# Patient Record
Sex: Female | Born: 1950 | Race: White | Hispanic: No | Marital: Single | Smoking: Current every day smoker
Health system: Southern US, Community
[De-identification: ages and names within clinical notes are randomized; demographics above are authoritative.]

## PROBLEM LIST (undated history)

## (undated) DIAGNOSIS — I639 Cerebral infarction, unspecified: Secondary | ICD-10-CM

## (undated) DIAGNOSIS — D126 Benign neoplasm of colon, unspecified: Secondary | ICD-10-CM

## (undated) DIAGNOSIS — F329 Major depressive disorder, single episode, unspecified: Secondary | ICD-10-CM

## (undated) DIAGNOSIS — F419 Anxiety disorder, unspecified: Secondary | ICD-10-CM

## (undated) DIAGNOSIS — Z5189 Encounter for other specified aftercare: Secondary | ICD-10-CM

## (undated) DIAGNOSIS — M1990 Unspecified osteoarthritis, unspecified site: Secondary | ICD-10-CM

## (undated) DIAGNOSIS — K5900 Constipation, unspecified: Secondary | ICD-10-CM

## (undated) DIAGNOSIS — J449 Chronic obstructive pulmonary disease, unspecified: Secondary | ICD-10-CM

## (undated) DIAGNOSIS — H269 Unspecified cataract: Secondary | ICD-10-CM

## (undated) DIAGNOSIS — F32A Depression, unspecified: Secondary | ICD-10-CM

## (undated) DIAGNOSIS — E039 Hypothyroidism, unspecified: Secondary | ICD-10-CM

## (undated) HISTORY — DX: Unspecified osteoarthritis, unspecified site: M19.90

## (undated) HISTORY — DX: Cerebral infarction, unspecified: I63.9

## (undated) HISTORY — DX: Chronic obstructive pulmonary disease, unspecified: J44.9

## (undated) HISTORY — DX: Constipation, unspecified: K59.00

## (undated) HISTORY — DX: Unspecified cataract: H26.9

## (undated) HISTORY — DX: Major depressive disorder, single episode, unspecified: F32.9

## (undated) HISTORY — PX: POLYPECTOMY: SHX149

## (undated) HISTORY — PX: TONSILLECTOMY: SUR1361

## (undated) HISTORY — DX: Depression, unspecified: F32.A

## (undated) HISTORY — PX: COLONOSCOPY: SHX174

## (undated) HISTORY — DX: Anxiety disorder, unspecified: F41.9

## (undated) HISTORY — DX: Benign neoplasm of colon, unspecified: D12.6

## (undated) HISTORY — DX: Encounter for other specified aftercare: Z51.89

---
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
## 1999-08-28 ENCOUNTER — Other Ambulatory Visit: Admission: RE | Admit: 1999-08-28 | Discharge: 1999-08-28 | Payer: Self-pay | Admitting: Obstetrics and Gynecology

## 1999-09-10 ENCOUNTER — Ambulatory Visit (HOSPITAL_COMMUNITY): Admission: RE | Admit: 1999-09-10 | Discharge: 1999-09-10 | Payer: Self-pay | Admitting: Obstetrics and Gynecology

## 1999-09-10 ENCOUNTER — Encounter: Payer: Self-pay | Admitting: Obstetrics and Gynecology

## 1999-09-10 ENCOUNTER — Encounter (INDEPENDENT_AMBULATORY_CARE_PROVIDER_SITE_OTHER): Payer: Self-pay | Admitting: Specialist

## 2000-08-30 ENCOUNTER — Other Ambulatory Visit: Admission: RE | Admit: 2000-08-30 | Discharge: 2000-08-30 | Payer: Self-pay | Admitting: Obstetrics and Gynecology

## 2000-11-03 ENCOUNTER — Encounter: Admission: RE | Admit: 2000-11-03 | Discharge: 2000-11-03 | Payer: Self-pay | Admitting: Obstetrics and Gynecology

## 2000-11-03 ENCOUNTER — Encounter: Payer: Self-pay | Admitting: Obstetrics and Gynecology

## 2001-03-25 ENCOUNTER — Encounter: Payer: Self-pay | Admitting: Family Medicine

## 2001-03-25 ENCOUNTER — Encounter: Admission: RE | Admit: 2001-03-25 | Discharge: 2001-03-25 | Payer: Self-pay | Admitting: Family Medicine

## 2002-09-01 ENCOUNTER — Emergency Department (HOSPITAL_COMMUNITY): Admission: EM | Admit: 2002-09-01 | Discharge: 2002-09-01 | Payer: Self-pay | Admitting: Emergency Medicine

## 2002-09-04 ENCOUNTER — Emergency Department (HOSPITAL_COMMUNITY): Admission: EM | Admit: 2002-09-04 | Discharge: 2002-09-04 | Payer: Self-pay | Admitting: Emergency Medicine

## 2002-09-08 ENCOUNTER — Emergency Department (HOSPITAL_COMMUNITY): Admission: EM | Admit: 2002-09-08 | Discharge: 2002-09-08 | Payer: Self-pay | Admitting: Emergency Medicine

## 2002-09-15 ENCOUNTER — Emergency Department (HOSPITAL_COMMUNITY): Admission: EM | Admit: 2002-09-15 | Discharge: 2002-09-15 | Payer: Self-pay | Admitting: Emergency Medicine

## 2002-09-29 ENCOUNTER — Emergency Department (HOSPITAL_COMMUNITY): Admission: EM | Admit: 2002-09-29 | Discharge: 2002-09-29 | Payer: Self-pay | Admitting: Emergency Medicine

## 2005-01-12 DIAGNOSIS — D126 Benign neoplasm of colon, unspecified: Secondary | ICD-10-CM

## 2005-01-12 HISTORY — DX: Benign neoplasm of colon, unspecified: D12.6

## 2005-02-17 ENCOUNTER — Ambulatory Visit: Payer: Self-pay | Admitting: Gastroenterology

## 2005-03-06 ENCOUNTER — Ambulatory Visit: Payer: Self-pay | Admitting: Gastroenterology

## 2005-03-06 ENCOUNTER — Encounter (INDEPENDENT_AMBULATORY_CARE_PROVIDER_SITE_OTHER): Payer: Self-pay | Admitting: *Deleted

## 2007-01-13 DIAGNOSIS — Z5189 Encounter for other specified aftercare: Secondary | ICD-10-CM

## 2007-01-13 HISTORY — PX: SPINAL FUSION: SHX223

## 2007-01-13 HISTORY — DX: Encounter for other specified aftercare: Z51.89

## 2007-07-08 ENCOUNTER — Encounter: Admission: RE | Admit: 2007-07-08 | Discharge: 2007-07-08 | Payer: Self-pay | Admitting: Family Medicine

## 2008-05-17 ENCOUNTER — Ambulatory Visit: Payer: Self-pay | Admitting: Critical Care Medicine

## 2008-05-17 ENCOUNTER — Inpatient Hospital Stay (HOSPITAL_COMMUNITY): Admission: RE | Admit: 2008-05-17 | Discharge: 2008-05-29 | Payer: Self-pay | Admitting: Orthopedic Surgery

## 2008-05-21 ENCOUNTER — Encounter (INDEPENDENT_AMBULATORY_CARE_PROVIDER_SITE_OTHER): Payer: Self-pay | Admitting: Cardiology

## 2008-05-23 ENCOUNTER — Ambulatory Visit: Payer: Self-pay | Admitting: Vascular Surgery

## 2008-05-23 ENCOUNTER — Encounter (INDEPENDENT_AMBULATORY_CARE_PROVIDER_SITE_OTHER): Payer: Self-pay | Admitting: Orthopedic Surgery

## 2008-06-05 DIAGNOSIS — F4310 Post-traumatic stress disorder, unspecified: Secondary | ICD-10-CM | POA: Insufficient documentation

## 2008-06-05 DIAGNOSIS — J4489 Other specified chronic obstructive pulmonary disease: Secondary | ICD-10-CM | POA: Insufficient documentation

## 2008-06-05 DIAGNOSIS — Z780 Asymptomatic menopausal state: Secondary | ICD-10-CM | POA: Insufficient documentation

## 2008-06-05 DIAGNOSIS — F3289 Other specified depressive episodes: Secondary | ICD-10-CM | POA: Insufficient documentation

## 2008-06-05 DIAGNOSIS — F411 Generalized anxiety disorder: Secondary | ICD-10-CM | POA: Insufficient documentation

## 2008-06-05 DIAGNOSIS — J45909 Unspecified asthma, uncomplicated: Secondary | ICD-10-CM | POA: Insufficient documentation

## 2008-06-05 DIAGNOSIS — J449 Chronic obstructive pulmonary disease, unspecified: Secondary | ICD-10-CM

## 2008-06-05 DIAGNOSIS — F329 Major depressive disorder, single episode, unspecified: Secondary | ICD-10-CM

## 2008-06-05 DIAGNOSIS — M1990 Unspecified osteoarthritis, unspecified site: Secondary | ICD-10-CM | POA: Insufficient documentation

## 2009-11-04 ENCOUNTER — Emergency Department (HOSPITAL_COMMUNITY)
Admission: EM | Admit: 2009-11-04 | Discharge: 2009-11-04 | Payer: Self-pay | Source: Home / Self Care | Admitting: Emergency Medicine

## 2010-02-02 ENCOUNTER — Encounter: Payer: Self-pay | Admitting: Family Medicine

## 2010-02-06 ENCOUNTER — Encounter: Payer: Self-pay | Admitting: Gastroenterology

## 2010-02-13 NOTE — Letter (Signed)
Summary: Colonoscopy Letter  [facility]  [address] [place], [place] [address]   Phone: [phone]  Fax: [phone]      February 06, 2010 MRN: [id number]   Judith Hill [address] [place], [place]  [address]   Dear Ms. Ferch,   According to your medical record, it is time for you to schedule a Colonoscopy. The American Cancer Society recommends this procedure as a method to detect early colon cancer. Patients with a family history of colon cancer, or a personal history of colon polyps or inflammatory bowel disease are at increased risk.  This letter has been generated based on the recommendations made at the time of your procedure. If you feel that in your particular situation this may no longer apply, please contact our office.  Please call our office at [phone] to schedule this appointment or to update your records at your earliest convenience.  Thank you for cooperating with [place] to provide you with the very best care possible.   Sincerely,  Judie Petit T. Russella Dar, M.D.  [facility] [phone]

## 2010-04-22 LAB — GLUCOSE, CAPILLARY
Glucose-Capillary: 126 mg/dL — ABNORMAL HIGH (ref 70–99)
Glucose-Capillary: 160 mg/dL — ABNORMAL HIGH (ref 70–99)
Glucose-Capillary: 84 mg/dL (ref 70–99)
Glucose-Capillary: 101 mg/dL — ABNORMAL HIGH (ref 70–99)
Glucose-Capillary: 102 mg/dL — ABNORMAL HIGH (ref 70–99)
Glucose-Capillary: 104 mg/dL — ABNORMAL HIGH (ref 70–99)
Glucose-Capillary: 114 mg/dL — ABNORMAL HIGH (ref 70–99)
Glucose-Capillary: 121 mg/dL — ABNORMAL HIGH (ref 70–99)
Glucose-Capillary: 121 mg/dL — ABNORMAL HIGH (ref 70–99)
Glucose-Capillary: 129 mg/dL — ABNORMAL HIGH (ref 70–99)
Glucose-Capillary: 132 mg/dL — ABNORMAL HIGH (ref 70–99)
Glucose-Capillary: 134 mg/dL — ABNORMAL HIGH (ref 70–99)
Glucose-Capillary: 135 mg/dL — ABNORMAL HIGH (ref 70–99)
Glucose-Capillary: 135 mg/dL — ABNORMAL HIGH (ref 70–99)
Glucose-Capillary: 141 mg/dL — ABNORMAL HIGH (ref 70–99)
Glucose-Capillary: 148 mg/dL — ABNORMAL HIGH (ref 70–99)
Glucose-Capillary: 148 mg/dL — ABNORMAL HIGH (ref 70–99)
Glucose-Capillary: 149 mg/dL — ABNORMAL HIGH (ref 70–99)
Glucose-Capillary: 153 mg/dL — ABNORMAL HIGH (ref 70–99)
Glucose-Capillary: 157 mg/dL — ABNORMAL HIGH (ref 70–99)
Glucose-Capillary: 84 mg/dL (ref 70–99)

## 2010-04-22 LAB — URINE CULTURE

## 2010-04-22 LAB — BASIC METABOLIC PANEL
BUN: 22 mg/dL (ref 6–23)
BUN: 23 mg/dL (ref 6–23)
BUN: 12 mg/dL (ref 6–23)
CO2: 30 mEq/L (ref 19–32)
CO2: 23 mEq/L (ref 19–32)
CO2: 27 mEq/L (ref 19–32)
CO2: 28 mEq/L (ref 19–32)
CO2: 30 mEq/L (ref 19–32)
Calcium: 8.2 mg/dL — ABNORMAL LOW (ref 8.4–10.5)
Calcium: 8.7 mg/dL (ref 8.4–10.5)
Calcium: 7.5 mg/dL — ABNORMAL LOW (ref 8.4–10.5)
Calcium: 8.5 mg/dL (ref 8.4–10.5)
Calcium: 8.6 mg/dL (ref 8.4–10.5)
Calcium: 8.8 mg/dL (ref 8.4–10.5)
Chloride: 102 mEq/L (ref 96–112)
Chloride: 103 mEq/L (ref 96–112)
Chloride: 105 mEq/L (ref 96–112)
Chloride: 98 mEq/L (ref 96–112)
Creatinine, Ser: 0.93 mg/dL (ref 0.4–1.2)
Creatinine, Ser: 1.03 mg/dL (ref 0.4–1.2)
Creatinine, Ser: 0.7 mg/dL (ref 0.4–1.2)
Creatinine, Ser: 0.74 mg/dL (ref 0.4–1.2)
Creatinine, Ser: 0.83 mg/dL (ref 0.4–1.2)
Creatinine, Ser: 1.02 mg/dL (ref 0.4–1.2)
GFR calc Af Amer: >60 mL/min (ref >60)
GFR calc Af Amer: >60 mL/min (ref >60)
GFR calc Af Amer: >60 mL/min (ref >60)
GFR calc Af Amer: >60 mL/min (ref >60)
GFR calc Af Amer: >60 mL/min (ref >60)
GFR calc Af Amer: >60 mL/min (ref >60)
GFR calc non Af Amer: >60 mL/min (ref >60)
GFR calc non Af Amer: >60 mL/min (ref >60)
Glucose, Bld: 71 mg/dL (ref 70–99)
Glucose, Bld: 86 mg/dL (ref 70–99)
Glucose, Bld: 86 mg/dL (ref 70–99)
Glucose, Bld: 147 mg/dL — ABNORMAL HIGH (ref 70–99)
Glucose, Bld: 226 mg/dL — ABNORMAL HIGH (ref 70–99)
Potassium: 5 mEq/L (ref 3.5–5.1)
Potassium: 3.7 mEq/L (ref 3.5–5.1)
Potassium: 4.1 mEq/L (ref 3.5–5.1)
Sodium: 139 mEq/L (ref 135–145)
Sodium: 136 mEq/L (ref 135–145)

## 2010-04-22 LAB — CBC
HCT: 32.1 % — ABNORMAL LOW (ref 36.0–46.0)
HCT: 29.3 % — ABNORMAL LOW (ref 36.0–46.0)
Hemoglobin: 10.9 g/dL — ABNORMAL LOW (ref 12.0–15.0)
Hemoglobin: 11.8 g/dL — ABNORMAL LOW (ref 12.0–15.0)
MCHC: 33.8 g/dL (ref 30.0–36.0)
MCHC: 34 g/dL (ref 30.0–36.0)
MCHC: 34.1 g/dL (ref 30.0–36.0)
MCHC: 34.1 g/dL (ref 30.0–36.0)
MCHC: 34.4 g/dL (ref 30.0–36.0)
MCHC: 34.4 g/dL (ref 30.0–36.0)
MCV: 93.4 fL (ref 78.0–100.0)
MCV: 92.9 fL (ref 78.0–100.0)
MCV: 93.3 fL (ref 78.0–100.0)
Platelets: 232 10*3/uL (ref 150–400)
Platelets: 247 10*3/uL (ref 150–400)
Platelets: 127 10*3/uL — ABNORMAL LOW (ref 150–400)
Platelets: 134 10*3/uL — ABNORMAL LOW (ref 150–400)
RBC: 3.14 MIL/uL — ABNORMAL LOW (ref 3.87–5.11)
RBC: 3.14 MIL/uL — ABNORMAL LOW (ref 3.87–5.11)
RBC: 3.38 MIL/uL — ABNORMAL LOW (ref 3.87–5.11)
RBC: 3.51 MIL/uL — ABNORMAL LOW (ref 3.87–5.11)
RBC: 3.69 MIL/uL — ABNORMAL LOW (ref 3.87–5.11)
RDW: 14.4 % (ref 11.5–15.5)
RDW: 14.8 % (ref 11.5–15.5)
RDW: 14.8 % (ref 11.5–15.5)
RDW: 14.7 % (ref 11.5–15.5)
RDW: 14.9 % (ref 11.5–15.5)
WBC: 17.3 10*3/uL — ABNORMAL HIGH (ref 4.0–10.5)
WBC: 20.3 10*3/uL — ABNORMAL HIGH (ref 4.0–10.5)
WBC: 21.7 10*3/uL — ABNORMAL HIGH (ref 4.0–10.5)
WBC: 22 10*3/uL — ABNORMAL HIGH (ref 4.0–10.5)

## 2010-04-22 LAB — URINALYSIS, ROUTINE W REFLEX MICROSCOPIC
Bilirubin Urine: NEGATIVE
Glucose, UA: NEGATIVE mg/dL
Ketones, ur: NEGATIVE mg/dL
pH: 6 (ref 5.0–8.0)

## 2010-04-22 LAB — BLOOD GAS, ARTERIAL
Acid-Base Excess: 1.9 mmol/L (ref 0.0–2.0)
Acid-Base Excess: 2.4 mmol/L — ABNORMAL HIGH (ref 0.0–2.0)
Bicarbonate: 25.5 mEq/L — ABNORMAL HIGH (ref 20.0–24.0)
Bicarbonate: 25.9 mEq/L — ABNORMAL HIGH (ref 20.0–24.0)
Drawn by: 296031
FIO2: 40 %
Inspiratory PAP: 12
O2 Saturation: 92.5 %
pCO2 arterial: 36.1 mmHg (ref 35.0–45.0)
pCO2 arterial: 36.4 mmHg (ref 35.0–45.0)
pO2, Arterial: 58.8 mmHg — ABNORMAL LOW (ref 80.0–100.0)
pO2, Arterial: 86.8 mmHg (ref 80.0–100.0)

## 2010-04-22 LAB — MAGNESIUM
Magnesium: 2.5 mg/dL (ref 1.5–2.5)
Magnesium: 2.6 mg/dL — ABNORMAL HIGH (ref 1.5–2.5)

## 2010-04-22 LAB — CULTURE, BLOOD (ROUTINE X 2): Culture: NO GROWTH

## 2010-04-22 LAB — CK TOTAL AND CKMB (NOT AT ARMC)
CK, MB: 1.7 ng/mL (ref 0.3–4.0)
CK, MB: 2.6 ng/mL (ref 0.3–4.0)
Relative Index: 0.2 (ref 0.0–2.5)
Relative Index: 0.3 (ref 0.0–2.5)
Total CK: 1021 U/L — ABNORMAL HIGH (ref 7–177)
Total CK: 898 U/L — ABNORMAL HIGH (ref 7–177)

## 2010-04-22 LAB — CROSSMATCH

## 2010-04-22 LAB — POCT I-STAT 3, ART BLOOD GAS (G3+)
Acid-Base Excess: 2 mmol/L (ref 0.0–2.0)
Bicarbonate: 25.4 mEq/L — ABNORMAL HIGH (ref 20.0–24.0)
Bicarbonate: 28.6 mEq/L — ABNORMAL HIGH (ref 20.0–24.0)
O2 Saturation: 93 %
Patient temperature: 98.2
Patient temperature: 98.5
TCO2: 26 mmol/L (ref 0–100)
TCO2: 30 mmol/L (ref 0–100)
pCO2 arterial: 42.2 mmHg (ref 35.0–45.0)
pH, Arterial: 7.439 — ABNORMAL HIGH (ref 7.350–7.400)

## 2010-04-22 LAB — BRAIN NATRIURETIC PEPTIDE
Pro B Natriuretic peptide (BNP): 58 pg/mL (ref 0.0–100.0)
Pro B Natriuretic peptide (BNP): 429 pg/mL — ABNORMAL HIGH (ref 0.0–100.0)
Pro B Natriuretic peptide (BNP): 501 pg/mL — ABNORMAL HIGH (ref 0.0–100.0)

## 2010-04-22 LAB — DIFFERENTIAL
Eosinophils Absolute: 0 10*3/uL (ref 0.0–0.7)
Lymphocytes Relative: 3 % — ABNORMAL LOW (ref 12–46)
Lymphs Abs: 0.6 10*3/uL — ABNORMAL LOW (ref 0.7–4.0)
Monocytes Relative: 3 % (ref 3–12)
Neutrophils Relative %: 94 % — ABNORMAL HIGH (ref 43–77)

## 2010-04-22 LAB — HEMOGLOBIN AND HEMATOCRIT, BLOOD
HCT: 24 % — ABNORMAL LOW (ref 36.0–46.0)
Hemoglobin: 8.2 g/dL — ABNORMAL LOW (ref 12.0–15.0)

## 2010-04-22 LAB — TROPONIN I
Troponin I: 0.03 ng/mL (ref 0.00–0.06)
Troponin I: 0.04 ng/mL (ref 0.00–0.06)

## 2010-04-22 LAB — URINE MICROSCOPIC-ADD ON

## 2010-04-22 LAB — PHOSPHORUS
Phosphorus: 2.8 mg/dL (ref 2.3–4.6)
Phosphorus: 3.9 mg/dL (ref 2.3–4.6)
Phosphorus: 4.7 mg/dL — ABNORMAL HIGH (ref 2.3–4.6)

## 2010-04-22 LAB — SEDIMENTATION RATE: Sed Rate: 39 mm/hr — ABNORMAL HIGH (ref 0–22)

## 2010-04-22 LAB — POCT I-STAT 4, (NA,K, GLUC, HGB,HCT)
HCT: 30 % — ABNORMAL LOW (ref 36.0–46.0)
Hemoglobin: 10.2 g/dL — ABNORMAL LOW (ref 12.0–15.0)
Potassium: 5.1 mEq/L (ref 3.5–5.1)
Sodium: 143 mEq/L (ref 135–145)

## 2010-04-23 LAB — CBC
Hemoglobin: 13.9 g/dL (ref 12.0–15.0)
MCHC: 34.7 g/dL (ref 30.0–36.0)
MCV: 93.1 fL (ref 78.0–100.0)
RBC: 4.31 MIL/uL (ref 3.87–5.11)
WBC: 10.5 10*3/uL (ref 4.0–10.5)

## 2010-04-23 LAB — ABO/RH: ABO/RH(D): O POS

## 2010-05-27 NOTE — Op Note (Signed)
NAMECEARA, WRIGHTSON              ACCOUNT NO.:  [id number]   MEDICAL RECORD NO.:  [id number]          PATIENT TYPE:  INP   LOCATION:  2105                         FACILITY:  [facility]   PHYSICIAN:  Alvy Beal, MD    DATE OF BIRTH:  January 08, 1951   DATE OF PROCEDURE:  05/25/2008  DATE OF DISCHARGE:                               OPERATIVE REPORT   PREOPERATIVE DIAGNOSIS:  Wound dehiscence from previous posterior  transforaminal lumbar interbody fusion.   HISTORY:  This is a very pleasant 60 year old woman, a week ago the  patient underwent a 2-level TLIF at 4-5 and 5-1.  The patient's  postoperative course was complicated by respiratory failure.  She was  subsequently placed on a significant quantity of IV steroids for  pulmonary support.  Over the course of the last 4-5 days, the wound  began to dehisce.  There was also significant serous drainage, but no  erythema.   Because of the risk of wound infection and the fact that it was not  healing appropriately, the decision was made when she was cleared from  the critical care team standpoint to take her to the operating room for  I and D and wound closure.  This was discussed with the patient and  consent was obtained.   OPERATIVE NOTE:  The patient was brought to the operating room and  placed supine on the operating table.  After successful induction of  general anesthesia and endotracheal intubation, she was turned prone  onto the Wilson frame.  All bony prominences were well padded.  The back  was prepped and draped in a standard fashion with Betadine and soap.  The Monocryl stitch was noted to be broken.  This was easily removed and  the deep superficial 2-0 Vicryl sutures were also removed.  I then  removed the deep 1 Vicryl sutures to expose down to the hardware.  There  was no purulent material.  There was no significant bleeding.  I  irrigated copiously with normal saline.  I then reapproximated the  fascia with interrupted  #1 Vicryl suture.  I did place a deep drain  prior to doing that.  I then used a pulsatile lavage to clean the  superficial tissue.  I then closed in a layered fashion with 0 Vicryl  sutures to remove the dead space and then a 2-0 Vicryl suture.  On the  skin, I used a vertical running mattress with a 2-0 Prolene.   The wound itself was noted to be clean.  There was no foul odor or  evidence of infection.  A dry dressing was applied.  She was extubated  and transferred to PACU without incident.  At the end of case, all  needle and sponge counts were correct.      Alvy Beal, MD  Electronically Signed     DDB/MEDQ  D:  05/25/2008  T:  05/25/2008  Job:  [id number] [id number]

## 2010-05-27 NOTE — Consult Note (Signed)
NAME:  Judith Hill, Judith Hill              ACCOUNT NO.:  [id number]   MEDICAL RECORD NO.:  [id number]          PATIENT TYPE:  INP   LOCATION:  5003                         FACILITY:  [facility]   PHYSICIAN:  Darryl D. Prime, MD    DATE OF BIRTH:  08-16-1950   DATE OF CONSULTATION:  DATE OF DISCHARGE:                                 CONSULTATION   REASON FOR CONSULTATION:  A shortness of breath, hypoxia and  hypotension.   HISTORY OF PRESENT ILLNESS:  Judith Hill is a 60 year old female.  She  has a history of anxiety disorder, not otherwise specified, history of  post-traumatic stress disorder, history of depression, history of COPD,  history of asthma who has a family history of degenerative joint disease  with radiculopathy to the left leg and had elective surgery on May 17, 2008 for this.  She had degenerative lumbar disk disease L4-L5 and L5-  S1.  She had a transforaminal lumbar interbody fusion L4-S5, S5-S1 and  did well postoperatively.  She did, however, have a hemoglobin drop.  Her hemoglobin on April 29 was 13.9.  On May 6 at 1538, it was 9.6.  On  the morning of May 8, it was 8.2.  She had 2 units of packed red blood  cells.  The patient apparently over the last few hours (she could not  give a history because she is so short of breath) had progressive  shortness of breath and wheezing.  The patient had a chest x-ray ordered  around 2:00 on May 20, 2008, and it showed interval development of  significant bilateral air space process likely fulminant pulmonary  edema, ARD versus diffuse pneumonia also possible.  The patient is  having a cough and is profoundly short of breath.  Sats were 80% on room  air, placed on a face mask, 40 of IV Lasix was ordered, and we were  consulted.   PAST MEDICAL HISTORY AND PAST SURGICAL HISTORY:  As above.  1. Menopause.  2. Right arm fracture.  3. History of D&C in the past.   SOCIAL HISTORY:  She has a history of tobacco abuse, half pack per day  for the last 20 years.   FAMILY HISTORY:  The patient does not have a history of premature  coronary artery disease.   MEDICATIONS:  Unsure of the doses, but she is on Wellbutrin, estrogen,  progesterone, Xanax and possibly Cymbalta.   ALLERGIES:  She is allergic to penicillin and shellfish.   REVIEW OF SYSTEMS:  Could not be fully obtained due to significant  shortness of breath, but it is positive for arthritis and constipation.  She has been walking the halls since the surgery.   PHYSICAL EXAMINATION:  VITAL SIGNS:  Pulse is 108 with respiratory rate  in the range of about 32.  Sats as above.  The patient's temperature is  99, blood pressure initially was 89/40, recheck blood pressure was  120/50.  GENERAL:  The patient, in general, was an obese female in significant  respiratory distress on face mask.  HEENT:  Normocephalic, atraumatic.  Pupils equal, round  and reactive to  light.  Extraocular movements being intact.  Oropharynx is dry.  NECK:  Supple, no lymphadenopathy or thyromegaly.  No carotid bruits.  Jugular venous distention is very difficult to assess due to body  habitus.  LUNGS:  Shows bilateral inspiratory rales diffusely.  CARDIOVASCULAR:  Exam is regular rhythm with a fast rate and distant.  ABDOMEN:  Soft, nontender, nondistended with no splenomegaly.  There is  no tenderness.  EXTREMITIES:  No clubbing, cyanosis.  She has 2-3+ bilateral lower  extremity edema.  The wound on the back is clean, dry and intact.  The  patient is moving all extremities well.   LABORATORY DATA:  Recent laboratory data is pending.  Most recent labs  show a hemoglobin as above.  I-stat on May 17, 2008, showed a sodium of  143, potassium of 5, glucose of 77.  She did have on May 7 basic  metabolic panel showing sodium 119, potassium 4.5, chloride 107, CO2 23,  glucose 226, BUN of 12, creatinine 1.02.  The patient's EKG showed a  normal sinus mechanism, but it is tachycardic at 114  beats per minute.  Normal axis.  She did have R greater than S in V1 suggesting RV strain  type in the right ventricular hypertrophy with premature atrial  complexes.  The patient's chest x-ray is as above.   ASSESSMENT/PLAN:  This is a patient with a history of COPD who is  significantly short of breath after two units of packed red blood cells  and dropping her hemoglobin.  She is now wheezing and has rales.  She is  relatively hypotensive and profoundly hypoxic with increased work of  breathing.  At this time, we will transfer her to the ICU.  Will place  on moxifloxacin initially and consider vancomycin if she does not turn  around in the next few hours.  Sputum cultures and blood cultures will  be ordered.  Will also get a basic metabolic panel with CBC.  We will  place her on Solu-Medrol, nebs and get an arterial blood gas.  Solu-  Medrol will be times one dose for now.  As we feel she may have a  pneumonia here or at least a COPD exacerbation.  The patient's pulmonary  delay is suggested of congestive heart failure, and this will be most  likely the primary diagnosis.  At this time, we will get cardiac markers  x3 and follow her urine output with Lasix.  She may need additional  dose.  Ins' and out's will be strict with 2 grams sodium diet, weight  daily, 1500 mL fluid restriction.  Foley catheter was placed.  Will get  an echocardiogram.  We will follow her closely with you.      Darryl D. Prime, MD  Electronically Signed     DDP/MEDQ  D:  05/20/2008  T:  05/20/2008  Job:  [id number]

## 2010-05-27 NOTE — Op Note (Signed)
NAME:  Judith Hill, Judith Hill              ACCOUNT NO.:  [id number]   MEDICAL RECORD NO.:  [id number]          PATIENT TYPE:  INP   LOCATION:  2899                         FACILITY:  [facility]   PHYSICIAN:  Alvy Beal, MD    DATE OF BIRTH:  11/12/1950   DATE OF PROCEDURE:  DATE OF DISCHARGE:                               OPERATIVE REPORT   PREOPERATIVE DIAGNOSIS:  Degenerative lumbar disk disease with radicular  left leg pain, L4-5 and L5-S1.   POSTOPERATIVE DIAGNOSIS:  Degenerative lumbar disk disease with  radicular left leg pain, L4-5 and L5-S1.   OPERATIVE PROCEDURE:  Transforaminal lumbar interbody fusion L4-5, L5-  S1.   COMPLICATIONS:  None.   CONDITION:  Stable.   Instrumentations used was the Titan interbody cage, the long size 10 at  L4-5 and the long size 9 at L5-S1, packed with regional autograft bone,  mixed with Actifuse.  The pedicle screw system was the Stryker radius  pedicle screw system with pedicle screws placed on the right side at L4,  L5, and S1 and on the left at just L4 and S1.   FIRST ASSISTANT:  [name], PA.   HISTORY:  This is a very pleasant 60 year old woman who presented to my  office complaining of severe radicular left leg pain and ongoing  significant back pain.  Clinical and radiographic analysis confirmed the  diagnosis of two-level degenerative disk disease with diskogenic back  pain and radicular leg pain.  Attempts at conservative management have  failed to alleviate her symptoms and so she elected to proceed with  surgery.  All appropriate risks, benefits, and alternatives were  discussed with the patient and consent was obtained.   OPERATIVE NOTE:  The patient was brought to the operating room, placed  supine on the operating table.  After successful induction of general  anesthesia, endotracheal intubation, TEDs and SCD, and Foley were  applied and the patient was positioned supine on the [place] table.  All  bony prominences  were well-padded.  She was then turned prone onto the  spinal frame.  Again, the arms were placed overhead, and  I made sure  all bony prominences are well-padded.  The back was prepped and draped  in a standard fashion.  X-ray was then used to determine appropriate  incision site placement.  Once I had the incision sites, I then made a  midline incision, starting at the superior aspect of L4 and proceeding  down to the inferior aspect of S1.  Sharp dissection was carried out  through the adipose tissue down to the deep fascia.  Deep fascia was  sharply incised and then using a Cobb elevator, I sharply dissected the  paraspinal muscles to expose the entire L4, L5, and S1 spinous process,  the 3-4, 4-5, 5-1 facet complexes as well as the L4-L5 transverse  process and the sacral ala.  I then obtained hemostasis using bipolar  electrocautery.  Once I had the right side exposed, I had confirmed  again with lateral x-ray that I was at the appropriate level.  Once I  confirmed the L4  pedicle position, I then proceeded to the contralateral  side.  Using a Cobb elevator stripping technique, I approached the spine  in the same way I did on the right side to expose the entire left side.   At this point with the posterior spine properly dissected and  visualized, I then proceeded with my instrumentation.   I placed a pedicle awl at the junction of the transverse process and  lateral facet complex and the mid region of the transverse.  I then  under x-ray, confirmed satisfactory position, broached the cortex and  then used a pedicle finder to advance through the pedicle, and into the  intervertebral body.  At this point, this was done on the right-hand  side.  I then removed the pedicle probe palpated the hole with a ball-  tip feeler to ensure I had a solid bony canal and there was no pedicle  breach.  Once I confirmed this, I then tapped, rechecked with a ball-tip  feeler and then placed a 45-mm,  6.75 diameter pedicle screw at the L4  level.  I repeated this entire procedure at L5 on the right side and  placed same size screw.  At S1, I also repeated this procedure, this  same technique, but this time used a 6.25 diameter tap and then placed a  7.5 diameter 40-mm screw.  At this point, I rechecked the trajectory and  positioning of the pedicle screws in AP and lateral planes and again  they were noted to be satisfactory.   At this point with the right side instrumentation complete, I went to  the contralateral side.  Since the left side was the symptomatic side, I  elected not to put an L5 pedicle screw as I would be working above and  below this in order to do the TLIF and biomechanically it was not  necessary.   As such, using the same technique I used on the right-hand side, I  placed pedicle screws at L4 and at S1.  This was a 40-mm screw at L4,  and 35-mm screw at S1.   At this point in time with the hardware position, I then proceeded with  the decompression.   Using a double XL rongeur, I removed the entire S1 and L5 spinous  process and the majority of the L4.  I then developed a plane underneath  the S1 lamina and performed a complete laminectomy of S1.  This was  accomplished using double-action Leksell rongeur along with 2 and 3-mm  Kerrison rongeurs.  I resected the ligamentum flavum, exposed the  underlying thecal sac and removed the entire spinous process and lamina.  Of course, I left the majority of the right side lamina intact to apply  my bone graft to.  However, I did remove enough that I could have an  adequate central decompression.  I then proceeded at L5 and did a  complete laminectomy at L5.  I then did a partial laminotomy of L4.  At  this point in time, I had excellent visualization centrally.  I then  removed the entire L5 superior and inferior facet complex using an  osteotome.  I then resected the entire L4-L5 pars so that I could unroof  the L4  and L5 neural foramen.  At this point, I had clear visualization  of her exiting L4 nerve root, the L4 neural foramen, and the L5 nerve  root, and the L5 neural foramen.  I also was able to  visualize the  medial border of the S1 pedicle and the S1 nerve root.  I was able to  palpate down into the S1 foramen.  At this point, I then swept the  thecal sac medially and evaluated the L5-S1 disk space.  I placed a  neural patty to protect the exposed L5 nerve root and the S1 nerve root,  as it was traversing in the lateral recess.  I then retracted the thecal  sac and this gave me excellent visualization of the disk space.  I  confirmed that this was the L5-S1 disk space again with x-ray and then  incised the disk.  I then went up with consensual reamers, raspers, to  prep the endplates from size 7 up to size 9.  I noted the 9 rasper was  an excellent fit.  At this point, I then began completing my diskectomy  using a combination of angled rasps, curettes, and pituitary rongeurs.  I was able to remove all of the disk material, and I was able to  directly rasp the endplates of L5 and S1.  This was confirmed both with  x-ray and with auditory and tactile feedback.  Once I had prepped the  disk space at L5-S1 appropriately, I then mixed my bone graft that I had  harvested from the local decompression and mixed it with Actifuse.  I  then packed some of this in the anterior aspect of the disk space and  then obtained a size 9 Titan hydroxyapatite-coated cage, packet it with  bone graft and malleted it to the appropriate resting position.  Once I  had this graft properly seated, I irrigated the wound copiously with  normal saline and checked to ensure that there was no undue trauma to  the traversing S1 or exiting L5 nerve root.  Once I confirmed this, I  then moved to the L4 space.  Using the same technique that I used at L5  and S1, I performed a similar diskectomy.  At this point, I thought the  11  spacer would fit and I tried, however, it was too big.  I then  downsized it to a size 10.  The size 10 did go into the space and had  excellent interference fit.  At this point, once the TLIF cages were  positioned, I then contoured 2 rods, secured it to the pedicle screw  construct and compressed the construct.  I final locked all of the rods  and then placed a single cross-link for added torsional rigidity.  I  copiously irrigated the wound with normal saline and then using bipolar  electrocautery as well as FloSeal to obtain and maintain hemostasis.  A  deep drain was placed.  At this point, final x-rays were taken to  satisfactory position of the hardware in both the AP and lateral planes.   I then closed the deep fascia over the drain with interrupted #1 Vicryl  sutures, then a running 0 Vicryl stitch for the deep adipose tissue and  then and 2-0 on the superficial adipose tissue.  I then used a 3-0  Monocryl to close the skin edges.  Steri-Strips and dry dressing were  applied.  The patient was ultimately extubated and transferred to the  PACU without incident.  At the end of the case, all  needle and sponge  counts were correct.   ESTIMATED BLOOD LOSS:  1200 mL.      Alvy Beal, MD  Electronically Signed  DDB/MEDQ  D:  05/17/2008  T:  05/18/2008  Job:  [id number]

## 2010-05-30 NOTE — Discharge Summary (Signed)
NAME:  Judith Hill, Judith Hill              ACCOUNT NO.:  [id number]   MEDICAL RECORD NO.:  [id number]          PATIENT TYPE:  INP   LOCATION:  5030                         FACILITY:  [facility]   PHYSICIAN:  Alvy Beal, MD    DATE OF BIRTH:  08-07-1950   DATE OF ADMISSION:  05/17/2008  DATE OF DISCHARGE:  05/29/2008                               DISCHARGE SUMMARY   ADMISSION DIAGNOSIS:  Degenerative lumbar disk disease with radicular  left leg pain, L4-5 and L5-S1.   DISCHARGE DIAGNOSES:  Degenerative lumbar disk disease with radicular  left leg pain, L4-5 and L5-S1 plus posttraumatic pulmonary  insufficiency, obstructive chronic bronchitis with acute exacerbation.   PROCEDURES:  1. On May 17, 2008, transforaminal lumbar interbody fusion, L4-5 and L5-      S1.  2. On May 25, 2008, washout of the wound.   One consultation was obtained on May 20, 2008, which was Internal  Medicine consult.   BRIEF HISTORY:  [name] is a very pleasant 60 year old female, who  presented to Dr. Shon Baton' office complaining of severe radicular left leg  pain and ongoing significant back pain.  Clinical and radiographic  analysis confirmed the diagnosis of a 2-level degenerative disk disease  with diskogenic back pain and radicular leg pain.  All attempts at  conservative management have been tried and failed consisting of  physical therapy, injection therapy, nonnarcotic and narcotic  medications, so she elected to proceed with a surgery.  All appropriate  risks, benefits, and alternatives were discussed with the patient, and a  consent was obtained.   HOSPITAL COURSE:  The patient tolerated the initial procedure very well  and was transferred from the OR to the PACU without incident and  subsequently from the PACU to the orthopedic floor without incident.  On  postoperative day #1, the patient began to complain of some increasing  anxiety and poor pain control and some increasing anxiety attacks.  Postoperative day #2, the patient continued to complain of significant  pain and weakness.  Her hematocrit at the time was 27, therefore, Dr.  Shon Baton transfused her 2 units for a postop anemia.  Unfortunately,  during her transfusion, she developed increasing wheezing with shortness  of breath.  Chest x-ray done that night demonstrated significant  bilateral airspace most likely consistent with pulmonary edema, ARDS,  and diffuse pneumonia were also possible.  Therefore, an Internal  Medicine consultation was obtained.  Based on her O2 saturation and  ABGs, which demonstrated a blood gas of 7.4, pO2 of 86.8, pCO2 of 36.1,  bicarb of 25.9, and acid-base excess of 2.4, she was transferred to the  ICU.  The patient remained on the ICU for approximately 6 more days  until she was deemed stable.  It was noted on hospital course day #13,  Dr. Shon Baton was still concerned about some concern about her wound  healing as it was draining serosanguineous fluid and it appeared to be  not healing as was he would hope.  Therefore, on May 14, he did take her  back to the operating room for a washout and  wound reclosure.  After  this procedure, the patient began to show significant improvement.  Her  O2 sats have began to improve, and the patient on the May 18 was  therefore deemed as stable to be discharged home as she was working well  with physical therapy.  She was tolerating a regular diet.  She is  having regular bowel and bladder motion.  Her wound appeared to be  clean, dry, and intact with no further evidence of a wound dehiscence.  Therefore, the patient was deemed stable to be discharged home with home  health.   LABORATORIES AT DISCHARGE:  Sodium of 139, potassium of 4.5, chloride of  102, bicarb of 30, glucose was 71, BUN was 19, and creatinine was 1.3.  Her hemoglobin was 10.9 and her hematocrit was 32.2.   The patient's last set of vitals included a temperature of 97.5, pulse  of 91, blood  pressure of 98/63, a respiratory rate of 18, and 94%  saturation on room air.  The patient is being discharged to home with  home health care.   DISCHARGE MEDICATIONS:  1. New medicine of Percocet 10/325 one tablet p.o. every 6 hours for      pain.  2. Robaxin 500 mg 1 tablet p.o. q.8 h. p.r.n. muscle spasms.  3. Aspirin 81 mg daily.  4. She is allowed to continue her estrogen 0.5 mg.  5. Progesterone 200 mg daily.  6. Xanax 1 mg at night.  7. Wellbutrin XL 300 mg daily.  8. Symbyax 6/50 at night.  9. She is to discontinue her Darvocet.   The patient was given a preprinted discharge instructions that included  that she is to walk as much as possible.  She is to avoid lifting  anything over 6 pounds.  She is allowed to shower postoperative day #5.  She is not to soak the wound.  She is to call our office at [phone] to  schedule her followup appointment for a staple removal.  For  approximately 2 weeks after the date of her surgery, she is to call our  office again at [phone] to set up this appointment and she is to call  for any increasing pain, redness around the site, increased fevers  and/or chills.      [name], PA      Alvy Beal, MD  Electronically Signed    AC/MEDQ  D:  07/09/2008  T:  07/10/2008  Job:  [id number]

## 2010-05-30 NOTE — Op Note (Signed)
[facility]  Patient:    Judith Hill, Judith Hill                     MRN: [id number] Proc. Date: 09/10/99 Adm. Date:  [id number] Attending:  Rosalee Kaufman                           Operative Report  PREOPERATIVE DIAGNOSES:  Menometrorrhagia.  POSTOPERATIVE DIAGNOSES:  Menometrorrhagia.  OPERATION PERFORMED:  Dilation and curettage.  SURGEON:  Dr. Laureen Ochs.  ANESTHESIA:  MAC with local.  FINDINGS AND PROCEDURE:  The patient was prepped and draped in the usual fashion for a vaginal procedure. The patient examined and found to have a uterus top normal size. Adnexa clear. Following, a weighted speculum was placed in the posterior vagina and 1% xylocaine was infiltrated around the cervix. The cervix was grasped with a single tooth tenaculum and was sounded to 2 3/4 inches. The cervix was dilated and the cavity entered with a sharp curette. Only a small to moderate amount of tissue was obtained. No additional tissue was obtained with the round stone forceps and the serrated curette. The blood loss during the procedure was minimal. The patient tolerated the procedure well and was sent to the recovery room in good condition. DD:  09/10/99 TD:  09/11/99 Job: [id number]

## 2011-09-28 ENCOUNTER — Encounter: Payer: Self-pay | Admitting: Gastroenterology

## 2011-10-05 ENCOUNTER — Encounter: Payer: Self-pay | Admitting: Gastroenterology

## 2011-11-13 ENCOUNTER — Ambulatory Visit (AMBULATORY_SURGERY_CENTER): Payer: Medicare Other | Admitting: *Deleted

## 2011-11-13 VITALS — Ht 67.0 in | Wt 207.5 lb

## 2011-11-13 DIAGNOSIS — Z1211 Encounter for screening for malignant neoplasm of colon: Secondary | ICD-10-CM

## 2011-11-13 MED ORDER — MOVIPREP 100 G PO SOLR: ORAL | Status: DC

## 2011-11-27 ENCOUNTER — Ambulatory Visit (AMBULATORY_SURGERY_CENTER): Payer: Medicare Other | Admitting: Gastroenterology

## 2011-11-27 ENCOUNTER — Encounter: Payer: Self-pay | Admitting: Gastroenterology

## 2011-11-27 VITALS — BP 108/67 | HR 76 | Temp 97.8°F | Resp 17 | Ht 67.0 in | Wt 207.0 lb

## 2011-11-27 DIAGNOSIS — D126 Benign neoplasm of colon, unspecified: Secondary | ICD-10-CM

## 2011-11-27 DIAGNOSIS — Z1211 Encounter for screening for malignant neoplasm of colon: Secondary | ICD-10-CM

## 2011-11-27 DIAGNOSIS — Z86010 Personal history of colonic polyps: Secondary | ICD-10-CM

## 2011-11-27 MED ORDER — SODIUM CHLORIDE 0.9 % IV SOLN: 500.0000 mL | INTRAVENOUS | Status: DC

## 2011-11-27 NOTE — Op Note (Signed)
[facility] [address] [place], [address]   COLONOSCOPY PROCEDURE REPORT  PATIENT: Judith, Hill  MR#: [id number] BIRTHDATE: 07-27-1950 , 61  yrs. old GENDER: Female ENDOSCOPIST: Meryl Dare, MD, [facility] PROCEDURE DATE:  11/27/2011 PROCEDURE:   Colonoscopy with snare polypectomy ASA CLASS:   Class II INDICATIONS: patient's personal history of adenomatous colon polyps, 2007. MEDICATIONS: MAC sedation, administered by CRNA and propofol (Diprivan) 300mg  IV DESCRIPTION OF PROCEDURE:   After the risks benefits and alternatives of the procedure were thoroughly explained, informed consent was obtained.  A digital rectal exam revealed no abnormalities of the rectum.   The LB CF-[id number]  endoscope was introduced through the anus and advanced to the cecum, which was identified by both the appendix and ileocecal valve. No adverse events experienced.   The quality of the prep was excellent, using MoviPrep  The instrument was then slowly withdrawn as the colon was fully examined.  COLON FINDINGS: A sessile polyp measuring 1.4 cm in size was found at the hepatic flexure.  A piecemeal polypectomy was performed using snare cautery.  The resection was complete and the polyp tissue was completely retrieved.   Mild diverticulosis was noted in the sigmoid colon.   The colon was otherwise normal.  There was no diverticulosis, inflammation, polyps or cancers unless previously stated.  Retroflexed views revealed no abnormalities. The time to cecum=2 minutes 07 seconds.  Withdrawal time=11 minutes 19 seconds. The scope was withdrawn and the procedure completed.  COMPLICATIONS: There were no complications.  ENDOSCOPIC IMPRESSION: 1.   Sessile polyp measuring 1.4 cm at the hepatic flexure; polypectomy performed using snare cautery 2.   Mild diverticulosis was noted in the sigmoid colon   RECOMMENDATIONS: 1.  Hold aspirin, aspirin products, and anti-inflammatory  medication for 2 weeks. 2.  Await pathology results 3.  Repeat colonoscopy in 1 year if polyp adenomatous; 3 years if hyperplastic  eSigned:  Meryl Dare, MD, [facility] 11/27/2011 10:17 AM   cc: Benedetto Goad, MD

## 2011-11-27 NOTE — Patient Instructions (Addendum)
Impressions/recommendations:  Polyp-handout given Diverticulosis-handout given High fiber diet-handout given  Hold aspirin, aspirin products, and antiinflammatory medications for 2 weeks. May restart 12/12/11.  Repeat colonoscopy is dependent upon pathology results. Letter will be mailed within 1-3 weeks.  YOU HAD AN ENDOSCOPIC PROCEDURE TODAY AT THE [facility]: Refer to the procedure report that was given to you for any specific questions about what was found during the examination.  If the procedure report does not answer your questions, please call your gastroenterologist to clarify.  If you requested that your care partner not be given the details of your procedure findings, then the procedure report has been included in a sealed envelope for you to review at your convenience later.  YOU SHOULD EXPECT: Some feelings of bloating in the abdomen. Passage of more gas than usual.  Walking can help get rid of the air that was put into your GI tract during the procedure and reduce the bloating. If you had a lower endoscopy (such as a colonoscopy or flexible sigmoidoscopy) you may notice spotting of blood in your stool or on the toilet paper. If you underwent a bowel prep for your procedure, then you may not have a normal bowel movement for a few days.  DIET: Your first meal following the procedure should be a light meal and then it is ok to progress to your normal diet.  A half-sandwich or bowl of soup is an example of a good first meal.  Heavy or fried foods are harder to digest and may make you feel nauseous or bloated.  Likewise meals heavy in dairy and vegetables can cause extra gas to form and this can also increase the bloating.  Drink plenty of fluids but you should avoid alcoholic beverages for 24 hours.  ACTIVITY: Your care partner should take you home directly after the procedure.  You should plan to take it easy, moving slowly for the rest of the day.  You can resume normal  activity the day after the procedure however you should NOT DRIVE or use heavy machinery for 24 hours (because of the sedation medicines used during the test).    SYMPTOMS TO REPORT IMMEDIATELY: A gastroenterologist can be reached at any hour.  During normal business hours, 8:30 AM to 5:00 PM Monday through Friday, call [phone].  After hours and on weekends, please call the GI answering service at [phone] who will take a message and have the physician on call contact you.   Following lower endoscopy (colonoscopy or flexible sigmoidoscopy):  Excessive amounts of blood in the stool  Significant tenderness or worsening of abdominal pains  Swelling of the abdomen that is new, acute  Fever of 100F or higher   FOLLOW UP: If any biopsies were taken you will be contacted by phone or by letter within the next 1-3 weeks.  Call your gastroenterologist if you have not heard about the biopsies in 3 weeks.  Our staff will call the home number listed on your records the next business day following your procedure to check on you and address any questions or concerns that you may have at that time regarding the information given to you following your procedure. This is a courtesy call and so if there is no answer at the home number and we have not heard from you through the emergency physician on call, we will assume that you have returned to your regular daily activities without incident.  SIGNATURES/CONFIDENTIALITY: You and/or your care partner have signed  paperwork which will be entered into your electronic medical record.  These signatures attest to the fact that that the information above on your After Visit Summary has been reviewed and is understood.  Full responsibility of the confidentiality of this discharge information lies with you and/or your care-partner.

## 2011-11-27 NOTE — Progress Notes (Signed)
Patient did not experience any of the following events: a burn prior to discharge; a fall within the facility; wrong site/side/patient/procedure/implant event; or a hospital transfer or hospital admission upon discharge from the facility. (G8907) Patient did not have preoperative order for IV antibiotic SSI prophylaxis. (G8918)  

## 2011-11-30 ENCOUNTER — Telehealth: Payer: Self-pay | Admitting: *Deleted

## 2011-11-30 NOTE — Telephone Encounter (Signed)
Left message

## 2011-12-01 ENCOUNTER — Encounter: Payer: Self-pay | Admitting: Gastroenterology

## 2012-09-19 ENCOUNTER — Encounter: Payer: Self-pay | Admitting: Gastroenterology

## 2012-10-10 ENCOUNTER — Encounter (HOSPITAL_COMMUNITY): Payer: Self-pay

## 2012-10-10 ENCOUNTER — Emergency Department (HOSPITAL_COMMUNITY): Payer: Medicare Other

## 2012-10-10 ENCOUNTER — Emergency Department (HOSPITAL_COMMUNITY)
Admission: EM | Admit: 2012-10-10 | Discharge: 2012-10-10 | Disposition: A | Discharge status: Home / Self Care | Payer: Medicare Other | Attending: Emergency Medicine | Admitting: Emergency Medicine

## 2012-10-10 DIAGNOSIS — F3289 Other specified depressive episodes: Secondary | ICD-10-CM | POA: Insufficient documentation

## 2012-10-10 DIAGNOSIS — F17200 Nicotine dependence, unspecified, uncomplicated: Secondary | ICD-10-CM | POA: Insufficient documentation

## 2012-10-10 DIAGNOSIS — F411 Generalized anxiety disorder: Secondary | ICD-10-CM | POA: Insufficient documentation

## 2012-10-10 DIAGNOSIS — Z79899 Other long term (current) drug therapy: Secondary | ICD-10-CM | POA: Insufficient documentation

## 2012-10-10 DIAGNOSIS — F329 Major depressive disorder, single episode, unspecified: Secondary | ICD-10-CM | POA: Insufficient documentation

## 2012-10-10 DIAGNOSIS — H538 Other visual disturbances: Secondary | ICD-10-CM | POA: Insufficient documentation

## 2012-10-10 DIAGNOSIS — R51 Headache: Secondary | ICD-10-CM | POA: Insufficient documentation

## 2012-10-10 DIAGNOSIS — R110 Nausea: Secondary | ICD-10-CM | POA: Insufficient documentation

## 2012-10-10 DIAGNOSIS — Z8739 Personal history of other diseases of the musculoskeletal system and connective tissue: Secondary | ICD-10-CM | POA: Insufficient documentation

## 2012-10-10 DIAGNOSIS — M542 Cervicalgia: Secondary | ICD-10-CM | POA: Insufficient documentation

## 2012-10-10 DIAGNOSIS — R42 Dizziness and giddiness: Secondary | ICD-10-CM | POA: Insufficient documentation

## 2012-10-10 DIAGNOSIS — Z880 Allergy status to penicillin: Secondary | ICD-10-CM | POA: Insufficient documentation

## 2012-10-10 LAB — CBC WITH DIFFERENTIAL/PLATELET
Basophils Absolute: 0.1 10*3/uL (ref 0.0–0.1)
Basophils Relative: 1 % (ref 0–1)
Eosinophils Absolute: 0.1 10*3/uL (ref 0.0–0.7)
Eosinophils Relative: 2 % (ref 0–5)
HCT: 42.5 % (ref 36.0–46.0)
Lymphocytes Relative: 25 % (ref 12–46)
Lymphs Abs: 1.9 10*3/uL (ref 0.7–4.0)
MCH: 31.5 pg (ref 26.0–34.0)
MCV: 95.7 fL (ref 78.0–100.0)
Monocytes Absolute: 0.4 10*3/uL (ref 0.1–1.0)
Monocytes Relative: 6 % (ref 3–12)
Neutro Abs: 5 10*3/uL (ref 1.7–7.7)
Platelets: 212 10*3/uL (ref 150–400)
RBC: 4.44 MIL/uL (ref 3.87–5.11)
RDW: 14 % (ref 11.5–15.5)
WBC: 7.5 10*3/uL (ref 4.0–10.5)

## 2012-10-10 LAB — BASIC METABOLIC PANEL
CO2: 27 mEq/L (ref 19–32)
Calcium: 9.7 mg/dL (ref 8.4–10.5)
Chloride: 104 mEq/L (ref 96–112)
Creatinine, Ser: 0.79 mg/dL (ref 0.50–1.10)
GFR calc Af Amer: >90 mL/min (ref >90)
Glucose, Bld: 115 mg/dL — ABNORMAL HIGH (ref 70–99)
Potassium: 4.1 mEq/L (ref 3.5–5.1)

## 2012-10-10 LAB — TROPONIN I: Troponin I: <0.3 ng/mL (ref <0.30)

## 2012-10-10 MED ORDER — ONDANSETRON HCL 4 MG/2ML IJ SOLN
4.0000 mg | Freq: Once | INTRAMUSCULAR | Status: AC
  Administered 2012-10-10: 4 mg via INTRAVENOUS
  Filled 2012-10-10: qty 2

## 2012-10-10 MED ORDER — ONDANSETRON 4 MG PO TBDP: 4.0000 mg | ORAL_TABLET | Freq: Three times a day (TID) | ORAL | Status: DC | PRN

## 2012-10-10 MED ORDER — MECLIZINE HCL 12.5 MG PO TABS
25.0000 mg | ORAL_TABLET | Freq: Once | ORAL | Status: AC
  Administered 2012-10-10: 25 mg via ORAL
  Filled 2012-10-10: qty 2

## 2012-10-10 MED ORDER — SODIUM CHLORIDE 0.9 % IV BOLUS (SEPSIS)
500.0000 mL | Freq: Once | INTRAVENOUS | Status: AC
  Administered 2012-10-10: 500 mL via INTRAVENOUS

## 2012-10-10 MED ORDER — SODIUM CHLORIDE 0.9 % IV SOLN: INTRAVENOUS | Status: DC

## 2012-10-10 MED ORDER — MECLIZINE HCL 25 MG PO TABS: 25.0000 mg | ORAL_TABLET | Freq: Four times a day (QID) | ORAL | Status: DC

## 2012-10-10 MED ORDER — HYDROMORPHONE HCL PF 1 MG/ML IJ SOLN
1.0000 mg | Freq: Once | INTRAMUSCULAR | Status: AC
  Administered 2012-10-10: 1 mg via INTRAVENOUS
  Filled 2012-10-10: qty 1

## 2012-10-10 NOTE — ED Notes (Signed)
C/o dizziness that started this am appox 8am per family. Pt states she woke up and " room was spinning" c/o left jaw pain and left side headache,

## 2012-10-10 NOTE — ED Provider Notes (Signed)
CSN: [id number]     Arrival date & time 10/10/12  4098 History  This chart was scribed for Shelda Jakes, MD by Bennett Scrape, ED Scribe. This patient was seen in room APA12/APA12 and the patient's care was started at 10:59 AM.   Chief Complaint  Patient presents with  . Dizziness    Patient is a 62 y.o. female presenting with general illness. The history is provided by the patient. No language interpreter was used.  Illness Location:  Dizziness Quality:  Room spinning Severity:  Severe Onset quality:  Sudden Duration:  3 hours Timing:  Constant Progression:  Unchanged Chronicity:  New Context:  Started upon waking Relieved by:  Laying down Worsened by:  Movement Associated symptoms: headaches (left) and nausea   Associated symptoms: no abdominal pain, no chest pain, no congestion, no cough, no diarrhea, no fever, no rash, no shortness of breath, no sore throat and no vomiting     HPI Comments: Judith Hill is a 62 y.o. female who presents to the Emergency Department complaining of dizziness that started at 8 AM today upon waking. Pt states that she sat up after laying on her left-side on her couch and had sudden onset dizziness described as room spinning. She states that the symptoms are worsened with movement, particularly head positioning, and improved with laying down. She states that she laid down on her right side to improve her symptoms but developed associated mild left jaw pain, mild left-sided HA and mild left hand paresthesias while laying down. The HA is located in the left forehead region and she rates her pain a 3 out of 10 currently. She states that is was a 5 with the original onset. She states that she has since developed nausea slowly in the ED as well. She denies having any symptoms prior to going to bed last night. Pt denies having prior episodes of similar symptoms. She denies emesis, fevers, CP and SOB as associated symptoms.  PCP is Dr. Benedetto Goad with  [facility] in [place]   Past Medical History  Diagnosis Date  . Anxiety   . Depression   . Arthritis   . Blood transfusion without reported diagnosis 2009    after spinal fusion   Past Surgical History  Procedure Laterality Date  . Spinal fusion  2009  . Fracture arm  1965    compund fracture right arm; 7 surgeries with bone graft and skin graft   Family History  Problem Relation Age of Onset  . Colon cancer Paternal Grandmother 51  . Stomach cancer Neg Hx   . Rectal cancer Neg Hx    History  Substance Use Topics  . Smoking status: Current Every Day Smoker -- 0.50 packs/day    Types: Cigarettes  . Smokeless tobacco: Never Used  . Alcohol Use: No   No OB history provided.  Review of Systems  Constitutional: Negative for fever and chills.  HENT: Positive for neck pain (left). Negative for congestion and sore throat.   Eyes: Positive for visual disturbance.  Respiratory: Negative for cough and shortness of breath.   Cardiovascular: Negative for chest pain and leg swelling.  Gastrointestinal: Positive for nausea. Negative for vomiting, abdominal pain and diarrhea.  Genitourinary: Negative for dysuria.  Musculoskeletal: Negative for back pain.  Skin: Negative for rash.  Neurological: Positive for dizziness and headaches (left).  Hematological: Does not bruise/bleed easily.  Psychiatric/Behavioral: Negative for confusion.    Allergies  Penicillins  Home Medications   Current Outpatient  Rx  Name  Route  Sig  Dispense  Refill  . albuterol (PROVENTIL HFA;VENTOLIN HFA) 108 (90 BASE) MCG/ACT inhaler   Inhalation   Inhale 2 puffs into the lungs every 6 (six) hours as needed for wheezing.         Marland Kitchen ALPRAZolam (XANAX) 1 MG tablet   Oral   Take 1 mg by mouth at bedtime as needed.          Marland Kitchen b complex vitamins tablet   Oral   Take 1 tablet by mouth daily.         Marland Kitchen buPROPion (WELLBUTRIN XL) 150 MG 24 hr tablet   Oral   Take 150 mg by mouth daily.          Marland Kitchen escitalopram (LEXAPRO) 20 MG tablet   Oral   Take 20 mg by mouth daily.         . Multiple Vitamins-Minerals (MULTIVITAMINS THER. W/MINERALS) TABS tablet   Oral   Take 1 tablet by mouth daily.         . Omega-3 Fatty Acids (FISH OIL PO)   Oral   Take 1 capsule by mouth daily.          Marland Kitchen OVER THE COUNTER MEDICATION   Oral   Take 1 tablet by mouth daily as needed (pain). OTC pain reliever.         . traMADol (ULTRAM) 50 MG tablet   Oral   Take 50 mg by mouth daily as needed for pain.         . meclizine (ANTIVERT) 25 MG tablet   Oral   Take 1 tablet (25 mg total) by mouth 4 (four) times daily.   28 tablet   0   . ondansetron (ZOFRAN ODT) 4 MG disintegrating tablet   Oral   Take 1 tablet (4 mg total) by mouth every 8 (eight) hours as needed.   10 tablet   0    Triage Vitals: BP 129/81  Pulse 76  Temp(Src) 98.5 F (36.9 C) (Oral)  Resp 17  SpO2 96%  Physical Exam  Nursing note and vitals reviewed. Constitutional: She is oriented to person, place, and time. She appears well-developed and well-nourished. No distress.  HENT:  Head: Normocephalic and atraumatic.  Mouth/Throat: Oropharynx is clear and moist.  Eyes: Conjunctivae and EOM are normal. Pupils are equal, round, and reactive to light.  Sclera are clear  Neck: Neck supple. No tracheal deviation present.  Cardiovascular: Normal rate and regular rhythm.   No murmur heard. Pulses:      Dorsalis pedis pulses are 2+ on the right side, and 2+ on the left side.  Pulmonary/Chest: Effort normal and breath sounds normal. No respiratory distress. She has no wheezes.  Abdominal: Soft. Bowel sounds are normal. She exhibits no distension. There is no tenderness.  Musculoskeletal: Normal range of motion. She exhibits no edema (no ankle swelling).  Lymphadenopathy:    She has no cervical adenopathy.  Neurological: She is alert and oriented to person, place, and time. No cranial nerve deficit.  Pt able to move  both sets of fingers and toes  Skin: Skin is warm and dry. No rash noted.  Psychiatric: She has a normal mood and affect. Her behavior is normal.    ED Course  Procedures (including critical care time)  DIAGNOSTIC STUDIES: Oxygen Saturation is 96% on room air, normal by my interpretation.    COORDINATION OF CARE: 11:07 AM-Informed pt that her CT scan was  normal. Discussed treatment plan which includes Antivert, MRI of the brain, CBC panel and BMP with pt at bedside and pt agreed to plan.   Labs Review Labs Reviewed  BASIC METABOLIC PANEL - Abnormal; Notable for the following:    Glucose, Bld 115 (*)    GFR calc non Af Amer 89 (*)    All other components within normal limits  CBC WITH DIFFERENTIAL  TROPONIN I   Imaging Review Dg Chest 1 View  10/10/2012   CLINICAL DATA:  Dizziness  EXAM: CHEST - 1 VIEW  COMPARISON:  Chest radiograph 11/04/2009  FINDINGS: Normal cardiac silhouette. There is mild bronchitic change centrally. There is pleural thickening at the lung apices unchanged from prior. No effusion, infiltrate, or pneumothorax.  IMPRESSION: No acute cardiopulmonary process.   Electronically Signed   By: Genevive Bi M.D.   On: 10/10/2012 13:42   Ct Head Wo Contrast  10/10/2012   CLINICAL DATA:  Dizziness with left-sided headache  EXAM: CT HEAD WITHOUT CONTRAST  TECHNIQUE: Contiguous axial images were obtained from the base of the skull through the vertex without intravenous contrast. Study was obtained within 24 hr of patient's arrival at the emergency department.  COMPARISON:  None.  FINDINGS: There is age related volume loss. There is no mass, hemorrhage, extra-axial fluid collection, or midline shift. There are no focal gray-white compartment lesions. No acute infarct present. Bony calvarium appears intact. The mastoid air cells are clear.  IMPRESSION: Age related volume loss. Study otherwise unremarkable.   Electronically Signed   By: Bretta Bang   On: 10/10/2012 10:01    Mr Brain Wo Contrast  10/10/2012   CLINICAL DATA:  The headache and weakness. Dizziness.  EXAM: MRI HEAD WITHOUT CONTRAST  TECHNIQUE: Multiplanar, multisequence MR imaging was performed. No intravenous contrast was administered.  COMPARISON:  CT head without contrast 10/10/2012.  FINDINGS: The diffusion-weighted images demonstrate no evidence for acute or subacute infarction. Minimal subcortical white matter disease is within normal limits for age. White matter changes are noted within the brainstem and. Ventricles are of normal size. No significant extra-axial fluid collection is present. A remote lacunar infarct of the right cerebellum demonstrates focal susceptibility suggesting a remote hemorrhage. No associated mass lesion is present.  Flow is present in the major intracranial arteries. The globes and orbits are intact the paranasal sinuses and mastoid air cells are clear.  IMPRESSION: And  1. Punctate hemorrhage within the and right cerebellum is compatible with a remote focal hemorrhagic lacunar infarct. 2. Bold no acute intracranial abnormality. 3. Mild white matter changes within the brainstem likely reflects the sequela of chronic microvascular ischemia.   Electronically Signed   By: Gennette Pac   On: 10/10/2012 13:35   Results for orders placed during the hospital encounter of 10/10/12  CBC WITH DIFFERENTIAL      Result Value Range   WBC 7.5  4.0 - 10.5 K/uL   RBC 4.44  3.87 - 5.11 MIL/uL   Hemoglobin 14.0  12.0 - 15.0 g/dL   HCT 16.1  09.6 - 04.5 %   MCV 95.7  78.0 - 100.0 fL   MCH 31.5  26.0 - 34.0 pg   MCHC 32.9  30.0 - 36.0 g/dL   RDW 40.9  81.1 - 91.4 %   Platelets 212  150 - 400 K/uL   Neutrophils Relative % 66  43 - 77 %   Neutro Abs 5.0  1.7 - 7.7 K/uL   Lymphocytes Relative 25  12 -  46 %   Lymphs Abs 1.9  0.7 - 4.0 K/uL   Monocytes Relative 6  3 - 12 %   Monocytes Absolute 0.4  0.1 - 1.0 K/uL   Eosinophils Relative 2  0 - 5 %   Eosinophils Absolute 0.1  0.0 - 0.7 K/uL    Basophils Relative 1  0 - 1 %   Basophils Absolute 0.1  0.0 - 0.1 K/uL  TROPONIN I      Result Value Range   Troponin I <0.30  <0.30 ng/mL  BASIC METABOLIC PANEL      Result Value Range   Sodium 139  135 - 145 mEq/L   Potassium 4.1  3.5 - 5.1 mEq/L   Chloride 104  96 - 112 mEq/L   CO2 27  19 - 32 mEq/L   Glucose, Bld 115 (*) 70 - 99 mg/dL   BUN 11  6 - 23 mg/dL   Creatinine, Ser 1.61  0.50 - 1.10 mg/dL   Calcium 9.7  8.4 - 09.6 mg/dL   GFR calc non Af Amer 89 (*) >90 mL/min   GFR calc Af Amer >90  >90 mL/min     Date: 10/10/2012  Rate: 86  Rhythm: normal sinus rhythm  QRS Axis: normal  Intervals: normal  ST/T Wave abnormalities: normal  Conduction Disutrbances:none  Narrative Interpretation:   Old EKG Reviewed: changes noted Resolution of sinus tachycardia and premature atrial complexes compared to EKG on 05/20/2008   MDM   1. Vertigo    Paced MRI of the brain is negative for any acute stroke. Or temporal tumor. Patient's symptoms consistent with positional vertigo. Improve somewhat with Antivert. Will continue with Antivert. Labs without any significant abnormalities. EKG without arrhythmias. Patient has primary care Dr. to followup with. MRI does show evidence of remote stroke that the patient was aware of.  I personally performed the services described in this documentation, which was scribed in my presence. The recorded information has been reviewed and is accurate.     Shelda Jakes, MD 10/10/12 [phone]

## 2013-04-28 ENCOUNTER — Ambulatory Visit (INDEPENDENT_AMBULATORY_CARE_PROVIDER_SITE_OTHER): Payer: Medicare Other | Admitting: Cardiology

## 2013-04-28 ENCOUNTER — Encounter: Payer: Self-pay | Admitting: Cardiology

## 2013-04-28 VITALS — BP 130/88 | HR 84 | Ht 67.0 in | Wt 229.0 lb

## 2013-04-28 DIAGNOSIS — R0600 Dyspnea, unspecified: Secondary | ICD-10-CM

## 2013-04-28 DIAGNOSIS — I6350 Cerebral infarction due to unspecified occlusion or stenosis of unspecified cerebral artery: Secondary | ICD-10-CM

## 2013-04-28 DIAGNOSIS — E669 Obesity, unspecified: Secondary | ICD-10-CM | POA: Insufficient documentation

## 2013-04-28 DIAGNOSIS — R0609 Other forms of dyspnea: Secondary | ICD-10-CM

## 2013-04-28 DIAGNOSIS — Z720 Tobacco use: Secondary | ICD-10-CM

## 2013-04-28 DIAGNOSIS — F4310 Post-traumatic stress disorder, unspecified: Secondary | ICD-10-CM

## 2013-04-28 DIAGNOSIS — R0989 Other specified symptoms and signs involving the circulatory and respiratory systems: Secondary | ICD-10-CM

## 2013-04-28 DIAGNOSIS — R42 Dizziness and giddiness: Secondary | ICD-10-CM

## 2013-04-28 DIAGNOSIS — I639 Cerebral infarction, unspecified: Secondary | ICD-10-CM

## 2013-04-28 DIAGNOSIS — F17200 Nicotine dependence, unspecified, uncomplicated: Secondary | ICD-10-CM

## 2013-04-28 DIAGNOSIS — R079 Chest pain, unspecified: Secondary | ICD-10-CM

## 2013-04-28 NOTE — Patient Instructions (Signed)
Your physician recommends that you continue on your current medications as directed. Please refer to the Current Medication list given to you today.  Your physician has requested that you have an echocardiogram. Echocardiography is a painless test that uses sound waves to create images of your heart. It provides your doctor with information about the size and shape of your heart and how well your heart's chambers and valves are working. This procedure takes approximately one hour. There are no restrictions for this procedure.  Your physician has requested that you have a lexiscan myoview. For further information please visit [email or web address]. Please follow instruction sheet, as given.  Will determine when to follow up after studies completed

## 2013-04-28 NOTE — Progress Notes (Signed)
Judith Hill Date of Birth:  [birth year] [address] [place], [place]  [address] [phone]         Fax   [phone]  History of Present Illness: This pleasant 63 year old woman is seen for the first time in my office.  She is seen at the request of Dr. Kathryne Eriksson.  The patient is concerned about vascular disease.  She has been having occasional chest discomfort often secondary to emotional stress.  She has a past history of post traumatic stress disorder.  She is on disability because of prior back surgery in 2010.  She states that during the hospitalization for her back surgery she developed pulmonary complications after receiving blood transfusion.  She has had problems with vertigo and was seen in the emergency room at [facility] on 10/10/12.  She underwent a MRI of the head which showed punctate hemorrhage within the right cerebellum compatible with a remote focal hemorrhagic lacunar infarct there was also white matter changes within the brain stem likely reflecting sequela of chronic microvascular ischemia.  The patient denies any history of high blood pressure.  She denies any history of hypercholesterolemia or diabetes.  He does smoke a pack of cigarettes a day.  She has experienced post traumatic stress disorder after seeing a person killed in front of her eyes.  She was also been under chronic stress regarding a 19 year old daughter who unfortunately is a drug addict.  The patient has not noted any exertional chest discomfort.  She does have exertional dyspnea.  She feels that she has problems with fluid retention at times.  Current Outpatient Prescriptions  Medication Sig Dispense Refill  . albuterol (PROVENTIL HFA;VENTOLIN HFA) 108 (90 BASE) MCG/ACT inhaler Inhale 2 puffs into the lungs every 6 (six) hours as needed for wheezing.      Marland Kitchen ALPRAZolam (XANAX) 1 MG tablet Take 1 mg by mouth at bedtime as needed.       Marland Kitchen b complex vitamins tablet Take 1 tablet by  mouth daily.      Marland Kitchen escitalopram (LEXAPRO) 20 MG tablet Take 20 mg by mouth daily.      . meclizine (ANTIVERT) 25 MG tablet Take 25 mg by mouth 2 (two) times daily as needed.      . Multiple Vitamins-Minerals (MULTIVITAMINS THER. W/MINERALS) TABS tablet Take 1 tablet by mouth daily.      . Omega-3 Fatty Acids (FISH OIL PO) Take 1 capsule by mouth daily.       . ondansetron (ZOFRAN ODT) 4 MG disintegrating tablet Take 1 tablet (4 mg total) by mouth every 8 (eight) hours as needed.  10 tablet  0  . OVER THE COUNTER MEDICATION Take 1 tablet by mouth daily as needed (pain). OTC pain reliever.      . traMADol (ULTRAM) 50 MG tablet Take 50 mg by mouth daily as needed for pain.       No current facility-administered medications for this visit.    Allergies  Allergen Reactions  . Penicillins Rash    Patient Active Problem List   Diagnosis Date Noted  . Dizziness 04/28/2013  . CVA (cerebral vascular accident) 04/28/2013  . Obesity (BMI 30-39.9) 04/28/2013  . ANXIETY DISORDER 06/05/2008  . PTSD 06/05/2008  . DEPRESSION 06/05/2008  . ASTHMA 06/05/2008  . COPD 06/05/2008  . DEGENERATIVE JOINT DISEASE 06/05/2008  . POSTMENOPAUSAL STATUS 06/05/2008    History  Smoking status  . Current Every Day Smoker -- 0.50 packs/day  .  Types: Cigarettes  Smokeless tobacco  . Never Used    History  Alcohol Use No    Family History  Problem Relation Age of Onset  . Colon cancer Paternal Grandmother 28  . Stomach cancer Neg Hx   . Rectal cancer Neg Hx     Review of Systems: Constitutional: no fever chills diaphoresis or fatigue or change in weight.  Head and neck: no hearing loss, no epistaxis, no photophobia or visual disturbance. Respiratory: No cough, positive for exertional dyspnea. Cardiovascular: Positive for occasional palpitations and for chest pain often secondary to emotional stress Gastrointestinal: No abdominal distention, no abdominal pain, no change in bowel habits hematochezia  or melena. Genitourinary: No dysuria, no frequency, no urgency, no nocturia. Musculoskeletal:No arthralgias, no back pain, no gait disturbance or myalgias. Neurological: No dizziness, no headaches, no numbness, no seizures, no syncope, no weakness, no tremors.  Positive for history of vertigo Hematologic: No lymphadenopathy, no easy bruising. Psychiatric: No confusion, no hallucinations, no sleep disturbance.  Positive for post traumatic stress disorder    Physical Exam: Filed Vitals:   04/28/13 1501  BP: 130/88  Pulse: 84   the general appearance reveals a large middle-aged woman in no acute distress.The head and neck exam reveals pupils equal and reactive.  Extraocular movements are full.  There is no scleral icterus.  The mouth and pharynx are normal.  The neck is supple.  The carotids reveal no bruits.  The jugular venous pressure is normal.  The  thyroid is not enlarged.  There is no lymphadenopathy.  The chest is clear to percussion and auscultation.  There are no rales or rhonchi.  Expansion of the chest is symmetrical.  The precordium is quiet.  The first heart sound is normal.  The second heart sound is physiologically split.  There is no murmur gallop rub or click.  There is no abnormal lift or heave.  The abdomen is soft and nontender.  The bowel sounds are normal.  The liver and spleen are not enlarged.  There are no abdominal masses.  There are no abdominal bruits.  Extremities reveal good pedal pulses.  There is no phlebitis or edema.  There is no cyanosis or clubbing.  Strength is normal and symmetrical in all extremities.  There is no lateralizing weakness.  There are no sensory deficits.  The skin is warm and dry.  There is no rash.  EKG shows normal sinus rhythm and no ischemic changes at rest.  A previous EKG from 2013 had shown anteroseptal T wave inversion in leads V1 and V2.   Assessment / Plan: 1.  dyspnea of uncertain etiology associated with atypical chest pain.  Risk  factors of tobacco abuse and exogenous obesity and sedentary lifestyle. 2.  history of remote stroke seen on MRI from September 2014 3. posttraumatic stress disorder 4. Obesity  Plan: The patient was counseled about the importance of stopping smoking.  She was also counseled about the importance of adhering to a heart healthy diet and losing weight. We will have her return for a two-dimensional echocardiogram and for a  Lexiscan Myoview stress test. Thank you for the opportunity to see this pleasant woman review and we will be in touch regarding the results of her studies

## 2013-05-29 ENCOUNTER — Telehealth: Payer: Self-pay | Admitting: Cardiology

## 2013-05-29 ENCOUNTER — Ambulatory Visit (HOSPITAL_COMMUNITY): Payer: Medicare Other | Attending: Cardiology | Admitting: Radiology

## 2013-05-29 ENCOUNTER — Ambulatory Visit (HOSPITAL_BASED_OUTPATIENT_CLINIC_OR_DEPARTMENT_OTHER): Payer: Medicare Other | Admitting: Radiology

## 2013-05-29 VITALS — BP 119/57 | HR 72 | Ht 67.0 in | Wt 232.0 lb

## 2013-05-29 DIAGNOSIS — R072 Precordial pain: Secondary | ICD-10-CM

## 2013-05-29 DIAGNOSIS — R0609 Other forms of dyspnea: Secondary | ICD-10-CM | POA: Insufficient documentation

## 2013-05-29 DIAGNOSIS — R51 Headache: Secondary | ICD-10-CM

## 2013-05-29 DIAGNOSIS — F17200 Nicotine dependence, unspecified, uncomplicated: Secondary | ICD-10-CM | POA: Insufficient documentation

## 2013-05-29 DIAGNOSIS — R079 Chest pain, unspecified: Secondary | ICD-10-CM

## 2013-05-29 DIAGNOSIS — R0600 Dyspnea, unspecified: Secondary | ICD-10-CM

## 2013-05-29 DIAGNOSIS — R0602 Shortness of breath: Secondary | ICD-10-CM

## 2013-05-29 DIAGNOSIS — Z8673 Personal history of transient ischemic attack (TIA), and cerebral infarction without residual deficits: Secondary | ICD-10-CM | POA: Insufficient documentation

## 2013-05-29 DIAGNOSIS — Z8249 Family history of ischemic heart disease and other diseases of the circulatory system: Secondary | ICD-10-CM | POA: Insufficient documentation

## 2013-05-29 DIAGNOSIS — I639 Cerebral infarction, unspecified: Secondary | ICD-10-CM

## 2013-05-29 DIAGNOSIS — R002 Palpitations: Secondary | ICD-10-CM | POA: Insufficient documentation

## 2013-05-29 DIAGNOSIS — R42 Dizziness and giddiness: Secondary | ICD-10-CM

## 2013-05-29 DIAGNOSIS — R0989 Other specified symptoms and signs involving the circulatory and respiratory systems: Secondary | ICD-10-CM | POA: Insufficient documentation

## 2013-05-29 MED ORDER — TECHNETIUM TC 99M SESTAMIBI GENERIC - CARDIOLITE
33.0000 | Freq: Once | INTRAVENOUS | Status: AC | PRN
  Administered 2013-05-29: 33 via INTRAVENOUS

## 2013-05-29 MED ORDER — REGADENOSON 0.4 MG/5ML IV SOLN
0.4000 mg | Freq: Once | INTRAVENOUS | Status: AC
  Administered 2013-05-29: 0.4 mg via INTRAVENOUS

## 2013-05-29 MED ORDER — AMINOPHYLLINE 25 MG/ML IV SOLN
75.0000 mg | Freq: Once | INTRAVENOUS | Status: AC
  Administered 2013-05-29: 75 mg via INTRAVENOUS

## 2013-05-29 NOTE — Telephone Encounter (Signed)
Advised patient of results.  

## 2013-05-29 NOTE — Progress Notes (Signed)
[facility] [address] [place],  [address] [phone]    Cardiology Nuclear Med Study  Judith Hill is a 63 y.o. female     MRN : [id number]     DOB: 1950/09/12  Procedure Date: 05/29/2013  Nuclear Med Background Indication for Stress Test:  Evaluation for Ischemia History: No prior known history of CAD, 2010 Echo: EF=55-60% Cardiac Risk Factors: CVA, Family History - CAD and Smoker  Symptoms: Chest Pain with/without exertion (last occurrence yesterday), Dizziness, DOE and Palpitations   Nuclear Pre-Procedure Caffeine/Decaff Intake:  None>12 hrs NPO After: 8:30pm   Lungs:  Minimal expiratory wheeze that cleared after cough. Albuterol Inhaler 2 sprays prior to [place]. O2 Sat: 98% on room air. IV 0.9% NS with Angio Cath:  22g  IV Site: L Hand x 1, tolerated well IV Started by:  Irven Baltimore, RN  Chest Size (in):  44 Cup Size: D  Height: 5\' 7"  (1.702 m)  Weight:  232 lb (105.235 kg)  BMI:  Body mass index is 36.33 kg/(m^2). Tech Comments:  N/A    Nuclear Med Study 1 or 2 day study: 2 day  Stress Test Type:  Lexiscan  Reading MD: N/A  Order Authorizing Provider:  Darlin Coco, MD  Resting Radionuclide: Technetium 33m Sestamibi  Resting Radionuclide Dose: 33.0 mCi  06/01/13  Stress Radionuclide:  Technetium 59m Sestamibi  Stress Radionuclide Dose: 33.0 mCi  06/29/13          Stress Protocol Rest HR: 72 Stress HR: 104  Rest BP: 119/57 Stress BP: 111/61  Exercise Time (min): n/a METS: n/a   Predicted Max HR: 160 bpm % Max HR: 65 bpm Rate Pressure Product: 11544   Dose of Adenosine (mg):  n/a Dose of Lexiscan: 0.4 mg  Dose of Atropine (mg): n/a Dose of Dobutamine: n/a mcg/kg/min (at max HR)  Stress Test Technologist: Irven Baltimore, RN  Nuclear Technologist:  Charlton Amor, CNMT     Rest Procedure:  Myocardial perfusion imaging was performed at rest 45 minutes following the intravenous administration of Technetium 25m  Sestamibi. Rest ECG: NSR - Normal EKG  Stress Procedure:  The patient received IV Lexiscan 0.4 mg over 15-seconds.  Technetium 68m Sestamibi injected at 30-seconds.  Quantitative spect images were obtained after a 45 minute delay. Aminophylline 75 mg IVP given 7 minutes post Lexiscan due to persistent chest pressure and headache with quick resolution of symptoms.  Stress ECG: No significant change from baseline ECG  QPS Raw Data Images:  Mild breast attenuation.  Normal left ventricular size. Stress Images:  Normal homogeneous uptake in all areas of the myocardium except for mild reduction in the anteroapical distrbution Rest Images:  Comparison with the stress images reveals no significant change. Subtraction (SDS):  There is a fixed anteriour defect that is most consistent with breast attenuation. Transient Ischemic Dilatation (Normal <1.22):  1.02 Lung/Heart Ratio (Normal <0.45):  0.71  Quantitative Gated Spect Images QGS EDV:  85 ml QGS ESV:  28 ml  Impression Exercise Capacity:  Lexiscan with no exercise. BP Response:  Normal blood pressure response. Clinical Symptoms:  No significant symptoms noted. ECG Impression:  No significant ECG changes with Lexiscan. Comparison with Prior Nuclear Study: No previous nuclear study performed  Overall Impression:  Low risk stress nuclear study mild breast attenuation artifact.  LV Ejection Fraction: 67%.  LV Wall Motion:  NL LV Function; NL Wall Motion   Sanda Klein, MD, [facility] [phone] office (  [phone] pager

## 2013-05-29 NOTE — Progress Notes (Signed)
Echocardiogram performed.  

## 2013-05-29 NOTE — Telephone Encounter (Signed)
New message ° ° ° ° ° ° ° ° ° °Pt returning nurses call °

## 2013-05-29 NOTE — Telephone Encounter (Signed)
Message copied by Earvin Hansen on Mon May 29, 2013  4:07 PM ------      Message from: Darlin Coco      Created: Mon May 29, 2013  1:50 PM       Please report.  The echo was normal. ------

## 2013-06-01 ENCOUNTER — Ambulatory Visit (HOSPITAL_COMMUNITY): Payer: Medicare Other | Attending: Cardiovascular Disease

## 2013-06-01 DIAGNOSIS — R0989 Other specified symptoms and signs involving the circulatory and respiratory systems: Secondary | ICD-10-CM

## 2013-06-01 MED ORDER — TECHNETIUM TC 99M SESTAMIBI GENERIC - CARDIOLITE
33.0000 | Freq: Once | INTRAVENOUS | Status: AC | PRN
  Administered 2013-06-01: 33 via INTRAVENOUS

## 2013-06-02 ENCOUNTER — Encounter: Payer: Self-pay | Admitting: *Deleted

## 2013-06-02 ENCOUNTER — Telehealth: Payer: Self-pay | Admitting: *Deleted

## 2013-06-02 NOTE — Telephone Encounter (Signed)
Advised patient

## 2013-06-02 NOTE — Telephone Encounter (Signed)
Message copied by Earvin Hansen on Fri Jun 02, 2013 11:20 AM ------      Message from: Darlin Coco      Created: Fri Jun 02, 2013 10:40 AM       Please report.  The stress test is normal.  No ischemia or scar. ------

## 2013-06-10 ENCOUNTER — Encounter: Payer: Self-pay | Admitting: Gastroenterology

## 2013-07-07 ENCOUNTER — Encounter: Payer: Self-pay | Admitting: Gastroenterology

## 2013-09-11 ENCOUNTER — Ambulatory Visit (AMBULATORY_SURGERY_CENTER): Payer: Self-pay

## 2013-09-11 VITALS — Ht 66.5 in | Wt 233.4 lb

## 2013-09-11 DIAGNOSIS — Z860100 Personal history of colon polyps, unspecified: Secondary | ICD-10-CM

## 2013-09-11 MED ORDER — MOVIPREP 100 G PO SOLR: 1.0000 | Freq: Once | ORAL | Status: DC

## 2013-09-11 NOTE — Progress Notes (Signed)
No allergies to eggs or soy No home oxygen but does use nebulizer if needed No diet/weight loss meds No past problems with anesthesia BUT  Had blood tx p spinal fusion surgery in 2009, reaction to tx caused lungs to fill with fluid in ICU 9days now has lung damage from that experience  Has email  Emmi instructions given for colonoscopy

## 2013-09-20 ENCOUNTER — Encounter: Payer: Self-pay | Admitting: Gastroenterology

## 2013-09-25 ENCOUNTER — Ambulatory Visit (AMBULATORY_SURGERY_CENTER): Payer: Medicare Other | Admitting: Gastroenterology

## 2013-09-25 ENCOUNTER — Encounter: Payer: Self-pay | Admitting: Gastroenterology

## 2013-09-25 VITALS — BP 110/68 | HR 66 | Temp 97.8°F | Resp 14 | Ht 66.5 in | Wt 233.0 lb

## 2013-09-25 DIAGNOSIS — Z860100 Personal history of colon polyps, unspecified: Secondary | ICD-10-CM

## 2013-09-25 DIAGNOSIS — D120 Benign neoplasm of cecum: Secondary | ICD-10-CM

## 2013-09-25 DIAGNOSIS — D126 Benign neoplasm of colon, unspecified: Secondary | ICD-10-CM

## 2013-09-25 MED ORDER — SODIUM CHLORIDE 0.9 % IV SOLN: 500.0000 mL | INTRAVENOUS | Status: DC

## 2013-09-25 NOTE — Patient Instructions (Signed)
Discharge instructions given with verbal understanding. Handout on polyps. Resume previous medications. YOU HAD AN ENDOSCOPIC PROCEDURE TODAY AT THE [facility]: Refer to the procedure report that was given to you for any specific questions about what was found during the examination.  If the procedure report does not answer your questions, please call your gastroenterologist to clarify.  If you requested that your care partner not be given the details of your procedure findings, then the procedure report has been included in a sealed envelope for you to review at your convenience later.  YOU SHOULD EXPECT: Some feelings of bloating in the abdomen. Passage of more gas than usual.  Walking can help get rid of the air that was put into your GI tract during the procedure and reduce the bloating. If you had a lower endoscopy (such as a colonoscopy or flexible sigmoidoscopy) you may notice spotting of blood in your stool or on the toilet paper. If you underwent a bowel prep for your procedure, then you may not have a normal bowel movement for a few days.  DIET: Your first meal following the procedure should be a light meal and then it is ok to progress to your normal diet.  A half-sandwich or bowl of soup is an example of a good first meal.  Heavy or fried foods are harder to digest and may make you feel nauseous or bloated.  Likewise meals heavy in dairy and vegetables can cause extra gas to form and this can also increase the bloating.  Drink plenty of fluids but you should avoid alcoholic beverages for 24 hours.  ACTIVITY: Your care partner should take you home directly after the procedure.  You should plan to take it easy, moving slowly for the rest of the day.  You can resume normal activity the day after the procedure however you should NOT DRIVE or use heavy machinery for 24 hours (because of the sedation medicines used during the test).    SYMPTOMS TO REPORT IMMEDIATELY: A  gastroenterologist can be reached at any hour.  During normal business hours, 8:30 AM to 5:00 PM Monday through Friday, call [phone].  After hours and on weekends, please call the GI answering service at [phone] who will take a message and have the physician on call contact you.   Following lower endoscopy (colonoscopy or flexible sigmoidoscopy):  Excessive amounts of blood in the stool  Significant tenderness or worsening of abdominal pains  Swelling of the abdomen that is new, acute  Fever of 100F or higher  FOLLOW UP: If any biopsies were taken you will be contacted by phone or by letter within the next 1-3 weeks.  Call your gastroenterologist if you have not heard about the biopsies in 3 weeks.  Our staff will call the home number listed on your records the next business day following your procedure to check on you and address any questions or concerns that you may have at that time regarding the information given to you following your procedure. This is a courtesy call and so if there is no answer at the home number and we have not heard from you through the emergency physician on call, we will assume that you have returned to your regular daily activities without incident.  SIGNATURES/CONFIDENTIALITY: You and/or your care partner have signed paperwork which will be entered into your electronic medical record.  These signatures attest to the fact that that the information above on your After Visit Summary has been   reviewed and is understood.  Full responsibility of the confidentiality of this discharge information lies with you and/or your care-partner. 

## 2013-09-25 NOTE — Progress Notes (Signed)
Called to room to assist during endoscopic procedure.  Patient ID and intended procedure confirmed with present staff. Received instructions for my participation in the procedure from the performing physician.  

## 2013-09-25 NOTE — Progress Notes (Signed)
Procedure ends, to recovery, report given and VSS. 

## 2013-09-25 NOTE — Op Note (Signed)
[place]  [address] [place], [address]   COLONOSCOPY PROCEDURE REPORT  PATIENT: Judith Hill, Judith Hill  MR#: [id number] BIRTHDATE: 04-Oct-1950 , 63  yrs. old GENDER: Female ENDOSCOPIST: Ladene Artist, MD, [facility] PROCEDURE DATE:  09/25/2013 PROCEDURE:   Colonoscopy with biopsy First Screening Colonoscopy - Avg.  risk and is 50 yrs.  old or older - No.  Prior Negative Screening - Now for repeat screening. N/A  History of Adenoma - Now for follow-up colonoscopy & has been > or = to 3 yrs.  Yes hx of adenoma.  Has been 3 or more years since last colonoscopy.  Polyps Removed Today? Yes. ASA CLASS:   Class II INDICATIONS:Patient's personal history of adenomatous colon polyps.  MEDICATIONS: MAC sedation, administered by CRNA and propofol (Diprivan) 240mg  IV DESCRIPTION OF PROCEDURE:   After the risks benefits and alternatives of the procedure were thoroughly explained, informed consent was obtained.  A digital rectal exam revealed no abnormalities of the rectum.   The LB [id number]  endoscope was introduced through the anus and advanced to the cecum, which was identified by both the appendix and ileocecal valve. No adverse events experienced.   The quality of the prep was excellent, using MoviPrep  The instrument was then slowly withdrawn as the colon was fully examined.  COLON FINDINGS: A sessile polyp measuring 5 mm in size was found at the cecum.  A polypectomy was performed with cold forceps.  The resection was complete and the polyp tissue was completely retrieved.   The colon was otherwise normal.  There was no diverticulosis, inflammation, polyps or cancers unless previously stated.  Retroflexed views revealed small internal hemorrhoids. The time to cecum=2 minutes 00 seconds.  Withdrawal time=9 minutes 14 seconds.  The scope was withdrawn and the procedure completed.  COMPLICATIONS: There were no complications.  ENDOSCOPIC IMPRESSION: 1.   Sessile  polyp measuring 5 mm at the cecum; polypectomy performed with cold forceps 2.   Small internal hemorrrhoids  RECOMMENDATIONS: 1.  Await pathology results 2.  High fiber diet with liberal fluid intake. 3.  Repeat Colonoscopy in 5 years.  eSigned:  Ladene Artist, MD, [facility] 09/25/2013 10:09 AM

## 2013-09-26 ENCOUNTER — Telehealth: Payer: Self-pay | Admitting: *Deleted

## 2013-09-26 NOTE — Telephone Encounter (Signed)
  Follow up Call-  Call back number 09/25/2013 11/27/2011  Post procedure Call Back phone  # [phone]  Permission to leave phone message Yes Yes     Patient questions:  Do you have a fever, pain , or abdominal swelling? No. Pain Score  0 *  Have you tolerated food without any problems? Yes.    Have you been able to return to your normal activities? Yes.    Do you have any questions about your discharge instructions: Diet   No. Medications  No. Follow up visit  No.  Do you have questions or concerns about your Care? No.  Actions: * If pain score is 4 or above: No action needed, pain <4.

## 2013-10-01 ENCOUNTER — Encounter: Payer: Self-pay | Admitting: Gastroenterology

## 2014-07-06 ENCOUNTER — Encounter: Payer: Self-pay | Admitting: Gastroenterology

## 2015-04-18 DIAGNOSIS — I10 Essential (primary) hypertension: Secondary | ICD-10-CM | POA: Insufficient documentation

## 2015-10-30 DIAGNOSIS — M545 Low back pain, unspecified: Secondary | ICD-10-CM | POA: Insufficient documentation

## 2015-10-30 DIAGNOSIS — G8929 Other chronic pain: Secondary | ICD-10-CM | POA: Insufficient documentation

## 2016-04-29 DIAGNOSIS — R7989 Other specified abnormal findings of blood chemistry: Secondary | ICD-10-CM | POA: Insufficient documentation

## 2016-10-28 DIAGNOSIS — R6882 Decreased libido: Secondary | ICD-10-CM | POA: Insufficient documentation

## 2016-10-28 DIAGNOSIS — L309 Dermatitis, unspecified: Secondary | ICD-10-CM | POA: Insufficient documentation

## 2016-10-28 DIAGNOSIS — Z636 Dependent relative needing care at home: Secondary | ICD-10-CM | POA: Insufficient documentation

## 2017-05-05 DIAGNOSIS — Z0000 Encounter for general adult medical examination without abnormal findings: Secondary | ICD-10-CM | POA: Insufficient documentation

## 2017-05-05 DIAGNOSIS — F4323 Adjustment disorder with mixed anxiety and depressed mood: Secondary | ICD-10-CM | POA: Insufficient documentation

## 2017-06-04 ENCOUNTER — Ambulatory Visit: Payer: Self-pay | Admitting: Gastroenterology

## 2017-09-28 NOTE — Patient Instructions (Addendum)
Your procedure is scheduled on: 10/07/2017  Report to [facility] at  54  AM.  Call this number if you have problems the morning of surgery: [phone]   Do not eat food or drink liquids :After Midnight.      Take these medicines the morning of surgery with A SIP OF WATER: xanax ( if needed), lexapro, antivert ( if needed), zofran, ultram ( if needed). Use your nebulizer before  you come.   Do not wear jewelry, make-up or nail polish.  Do not wear lotions, powders, or perfumes. You may wear deodorant.  Do not shave 48 hours prior to surgery.  Do not bring valuables to the hospital.  Contacts, dentures or bridgework may not be worn into surgery.  Leave suitcase in the car. After surgery it may be brought to your room.  For patients admitted to the hospital, checkout time is 11:00 AM the day of discharge.   Patients discharged the day of surgery will not be allowed to drive home.  :     Please read over the following fact sheets that you were given: Coughing and Deep Breathing, Surgical Site Infection Prevention, Anesthesia Post-op Instructions and Care and Recovery After Surgery    Cataract A cataract is a clouding of the lens of the eye. When a lens becomes cloudy, vision is reduced based on the degree and nature of the clouding. Many cataracts reduce vision to some degree. Some cataracts make people more near-sighted as they develop. Other cataracts increase glare. Cataracts that are ignored and become worse can sometimes look white. The white color can be seen through the pupil. CAUSES   Aging. However, cataracts may occur at any age, even in newborns.   Certain drugs.   Trauma to the eye.   Certain diseases such as diabetes.   Specific eye diseases such as chronic inflammation inside the eye or a sudden attack of a rare form of glaucoma.   Inherited or acquired medical problems.  SYMPTOMS   Gradual, progressive drop in vision in the affected eye.   Severe, rapid visual  loss. This most often happens when trauma is the cause.  DIAGNOSIS  To detect a cataract, an eye doctor examines the lens. Cataracts are best diagnosed with an exam of the eyes with the pupils enlarged (dilated) by drops.  TREATMENT  For an early cataract, vision may improve by using different eyeglasses or stronger lighting. If that does not help your vision, surgery is the only effective treatment. A cataract needs to be surgically removed when vision loss interferes with your everyday activities, such as driving, reading, or watching TV. A cataract may also have to be removed if it prevents examination or treatment of another eye problem. Surgery removes the cloudy lens and usually replaces it with a substitute lens (intraocular lens, IOL).  At a time when both you and your doctor agree, the cataract will be surgically removed. If you have cataracts in both eyes, only one is usually removed at a time. This allows the operated eye to heal and be out of danger from any possible problems after surgery (such as infection or poor wound healing). In rare cases, a cataract may be doing damage to your eye. In these cases, your caregiver may advise surgical removal right away. The vast majority of people who have cataract surgery have better vision afterward. HOME CARE INSTRUCTIONS  If you are not planning surgery, you may be asked to do the following:  Use different  eyeglasses.   Use stronger or brighter lighting.   Ask your eye doctor about reducing your medicine dose or changing medicines if it is thought that a medicine caused your cataract. Changing medicines does not make the cataract go away on its own.   Become familiar with your surroundings. Poor vision can lead to injury. Avoid bumping into things on the affected side. You are at a higher risk for tripping or falling.   Exercise extreme care when driving or operating machinery.   Wear sunglasses if you are sensitive to bright light or  experiencing problems with glare.  SEEK IMMEDIATE MEDICAL CARE IF:   You have a worsening or sudden vision loss.   You notice redness, swelling, or increasing pain in the eye.   You have a fever.  Document Released: 12/29/2004 Document Revised: 12/18/2010 Document Reviewed: 08/22/2010 [facility] Patient Information 2012 [place].PATIENT INSTRUCTIONS POST-ANESTHESIA  IMMEDIATELY FOLLOWING SURGERY:  Do not drive or operate machinery for the first twenty four hours after surgery.  Do not make any important decisions for twenty four hours after surgery or while taking narcotic pain medications or sedatives.  If you develop intractable nausea and vomiting or a severe headache please notify your doctor immediately.  FOLLOW-UP:  Please make an appointment with your surgeon as instructed. You do not need to follow up with anesthesia unless specifically instructed to do so.  WOUND CARE INSTRUCTIONS (if applicable):  Keep a dry clean dressing on the anesthesia/puncture wound site if there is drainage.  Once the wound has quit draining you may leave it open to air.  Generally you should leave the bandage intact for twenty four hours unless there is drainage.  If the epidural site drains for more than 36-48 hours please call the anesthesia department.  QUESTIONS?:  Please feel free to call your physician or the hospital operator if you have any questions, and they will be happy to assist you.

## 2017-09-30 ENCOUNTER — Encounter (HOSPITAL_COMMUNITY)
Admission: RE | Admit: 2017-09-30 | Discharge: 2017-09-30 | Disposition: A | Discharge status: Home / Self Care | Payer: Medicare Other | Source: Ambulatory Visit | Attending: Ophthalmology | Admitting: Ophthalmology

## 2017-09-30 ENCOUNTER — Encounter (HOSPITAL_COMMUNITY): Payer: Self-pay

## 2017-09-30 ENCOUNTER — Other Ambulatory Visit: Payer: Self-pay

## 2017-09-30 DIAGNOSIS — Z01818 Encounter for other preprocedural examination: Secondary | ICD-10-CM | POA: Diagnosis not present

## 2017-09-30 HISTORY — DX: Hypothyroidism, unspecified: E03.9

## 2017-09-30 LAB — CBC
HCT: 45.7 % (ref 36.0–46.0)
Hemoglobin: 15.1 g/dL — ABNORMAL HIGH (ref 12.0–15.0)
MCH: 32.1 pg (ref 26.0–34.0)
MCHC: 33 g/dL (ref 30.0–36.0)
MCV: 97 fL (ref 78.0–100.0)
PLATELETS: 223 10*3/uL (ref 150–400)
RBC: 4.71 MIL/uL (ref 3.87–5.11)
RDW: 13.9 % (ref 11.5–15.5)
WBC: 9.3 10*3/uL (ref 4.0–10.5)

## 2017-09-30 LAB — BASIC METABOLIC PANEL
Anion gap: 8 (ref 5–15)
BUN: 16 mg/dL (ref 8–23)
CALCIUM: 9.5 mg/dL (ref 8.9–10.3)
CO2: 27 mmol/L (ref 22–32)
CREATININE: 0.9 mg/dL (ref 0.44–1.00)
Chloride: 102 mmol/L (ref 98–111)
GFR calc Af Amer: >60 mL/min (ref >60)
GFR calc non Af Amer: >60 mL/min (ref >60)
Glucose, Bld: 82 mg/dL (ref 70–99)
Potassium: 4.4 mmol/L (ref 3.5–5.1)
SODIUM: 137 mmol/L (ref 135–145)

## 2017-10-06 MED ORDER — LIDOCAINE HCL 3.5 % OP GEL
OPHTHALMIC | Status: AC
  Filled 2017-10-06: qty 1

## 2017-10-06 MED ORDER — LIDOCAINE HCL (PF) 1 % IJ SOLN
INTRAMUSCULAR | Status: AC
  Filled 2017-10-06: qty 2

## 2017-10-06 MED ORDER — CYCLOPENTOLATE-PHENYLEPHRINE 0.2-1 % OP SOLN
OPHTHALMIC | Status: AC
  Filled 2017-10-06: qty 2

## 2017-10-06 MED ORDER — TETRACAINE HCL 0.5 % OP SOLN
OPHTHALMIC | Status: AC
  Filled 2017-10-06: qty 4

## 2017-10-06 MED ORDER — NEOMYCIN-POLYMYXIN-DEXAMETH 3.5-10000-0.1 OP SUSP
OPHTHALMIC | Status: AC
  Filled 2017-10-06: qty 5

## 2017-10-06 MED ORDER — PHENYLEPHRINE HCL 2.5 % OP SOLN
OPHTHALMIC | Status: AC
  Filled 2017-10-06: qty 15

## 2017-10-07 ENCOUNTER — Encounter (HOSPITAL_COMMUNITY): Payer: Self-pay | Admitting: *Deleted

## 2017-10-07 ENCOUNTER — Ambulatory Visit (HOSPITAL_COMMUNITY): Payer: Medicare Other | Admitting: Anesthesiology

## 2017-10-07 ENCOUNTER — Ambulatory Visit (HOSPITAL_COMMUNITY)
Admission: RE | Admit: 2017-10-07 | Discharge: 2017-10-07 | Disposition: A | Discharge status: Home / Self Care | Payer: Medicare Other | Source: Ambulatory Visit | Attending: Ophthalmology | Admitting: Ophthalmology

## 2017-10-07 ENCOUNTER — Encounter (HOSPITAL_COMMUNITY)
Admission: RE | Disposition: A | Discharge status: Home / Self Care | Payer: Self-pay | Source: Ambulatory Visit | Attending: Ophthalmology

## 2017-10-07 DIAGNOSIS — E039 Hypothyroidism, unspecified: Secondary | ICD-10-CM | POA: Insufficient documentation

## 2017-10-07 DIAGNOSIS — Z880 Allergy status to penicillin: Secondary | ICD-10-CM | POA: Diagnosis not present

## 2017-10-07 DIAGNOSIS — F4310 Post-traumatic stress disorder, unspecified: Secondary | ICD-10-CM | POA: Insufficient documentation

## 2017-10-07 DIAGNOSIS — Z79899 Other long term (current) drug therapy: Secondary | ICD-10-CM | POA: Insufficient documentation

## 2017-10-07 DIAGNOSIS — H2511 Age-related nuclear cataract, right eye: Secondary | ICD-10-CM | POA: Insufficient documentation

## 2017-10-07 DIAGNOSIS — Z8673 Personal history of transient ischemic attack (TIA), and cerebral infarction without residual deficits: Secondary | ICD-10-CM | POA: Insufficient documentation

## 2017-10-07 HISTORY — PX: CATARACT EXTRACTION W/PHACO: SHX586

## 2017-10-07 SURGERY — PHACOEMULSIFICATION, CATARACT, WITH IOL INSERTION
Anesthesia: Monitor Anesthesia Care | Site: Eye | Laterality: Right

## 2017-10-07 MED ORDER — LACTATED RINGERS IV SOLN: INTRAVENOUS | Status: DC

## 2017-10-07 MED ORDER — POVIDONE-IODINE 5 % OP SOLN
OPHTHALMIC | Status: DC | PRN
  Administered 2017-10-07: 1 via OPHTHALMIC

## 2017-10-07 MED ORDER — PHENYLEPHRINE HCL 2.5 % OP SOLN
1.0000 [drp] | OPHTHALMIC | Status: AC
  Administered 2017-10-07 (×3): 1 [drp] via OPHTHALMIC

## 2017-10-07 MED ORDER — SODIUM HYALURONATE 23 MG/ML IO SOLN
INTRAOCULAR | Status: DC | PRN
  Administered 2017-10-07: 0.6 mL via INTRAOCULAR

## 2017-10-07 MED ORDER — LIDOCAINE HCL (PF) 1 % IJ SOLN
INTRAOCULAR | Status: DC | PRN
  Administered 2017-10-07: .9 mL via OPHTHALMIC

## 2017-10-07 MED ORDER — EPINEPHRINE PF 1 MG/ML IJ SOLN
INTRAOCULAR | Status: DC | PRN
  Administered 2017-10-07: 500 mL

## 2017-10-07 MED ORDER — LACTATED RINGERS IV SOLN
INTRAVENOUS | Status: DC | PRN
  Administered 2017-10-07: 07:00:00 via INTRAVENOUS
  Administered 2017-10-07: 1000 mL

## 2017-10-07 MED ORDER — LIDOCAINE HCL 3.5 % OP GEL
1.0000 "application " | Freq: Once | OPHTHALMIC | Status: AC
  Administered 2017-10-07: 1 via OPHTHALMIC

## 2017-10-07 MED ORDER — MIDAZOLAM HCL 2 MG/2ML IJ SOLN
INTRAMUSCULAR | Status: AC
  Filled 2017-10-07: qty 2

## 2017-10-07 MED ORDER — NEOMYCIN-POLYMYXIN-DEXAMETH 3.5-10000-0.1 OP SUSP
OPHTHALMIC | Status: DC | PRN
  Administered 2017-10-07: 2 [drp] via OPHTHALMIC

## 2017-10-07 MED ORDER — PROVISC 10 MG/ML IO SOLN
INTRAOCULAR | Status: DC | PRN
  Administered 2017-10-07: 0.85 mL via INTRAOCULAR

## 2017-10-07 MED ORDER — BSS IO SOLN
INTRAOCULAR | Status: DC | PRN
  Administered 2017-10-07: 15 mL via INTRAOCULAR

## 2017-10-07 MED ORDER — CYCLOPENTOLATE-PHENYLEPHRINE 0.2-1 % OP SOLN
1.0000 [drp] | OPHTHALMIC | Status: AC
  Administered 2017-10-07 (×3): 1 [drp] via OPHTHALMIC

## 2017-10-07 MED ORDER — MIDAZOLAM HCL 5 MG/5ML IJ SOLN
INTRAMUSCULAR | Status: DC | PRN
  Administered 2017-10-07: 2 mg via INTRAVENOUS

## 2017-10-07 MED ORDER — TETRACAINE HCL 0.5 % OP SOLN
1.0000 [drp] | OPHTHALMIC | Status: AC
  Administered 2017-10-07 (×3): 1 [drp] via OPHTHALMIC

## 2017-10-07 SURGICAL SUPPLY — 14 items

## 2017-10-07 NOTE — Discharge Instructions (Addendum)
Please discharge patient when stable, will follow up today with Dr. Wrzosek at the [facility] office immediately following discharge.  Leave shield in place until visit.  All paperwork with discharge instructions will be given at the office. ° ° °PATIENT INSTRUCTIONS °POST-ANESTHESIA ° °IMMEDIATELY FOLLOWING SURGERY:  Do not drive or operate machinery for the first twenty four hours after surgery.  Do not make any important decisions for twenty four hours after surgery or while taking narcotic pain medications or sedatives.  If you develop intractable nausea and vomiting or a severe headache please notify your doctor immediately. ° °FOLLOW-UP:  Please make an appointment with your surgeon as instructed. You do not need to follow up with anesthesia unless specifically instructed to do so. ° °WOUND CARE INSTRUCTIONS (if applicable):  Keep a dry clean dressing on the anesthesia/puncture wound site if there is drainage.  Once the wound has quit draining you may leave it open to air.  Generally you should leave the bandage intact for twenty four hours unless there is drainage.  If the epidural site drains for more than 36-48 hours please call the anesthesia department. ° °QUESTIONS?:  Please feel free to call your physician or the hospital operator if you have any questions, and they will be happy to assist you.    ° ° ° °

## 2017-10-07 NOTE — Op Note (Signed)
Date of procedure: 10/07/17  Pre-operative diagnosis: Visually significant cataract, Right Eye (H25.11)  Post-operative diagnosis: Visually significant cataract, Right Eye  Procedure: Removal of cataract via phacoemulsification and insertion of intra-ocular lens Wynetta Emery and [place]  +19.5D into the capsular bag of the Right Eye  Attending surgeon: Gerda Diss. Kahealani Yankovich, MD, MA  Anesthesia: MAC, Topical Akten  Complications: None  Estimated Blood Loss: <23m (minimal)  Specimens: None  Implants: As above  Indications:  Visually significant cataract, Right Eye  Procedure:  The patient was seen and identified in the pre-operative area. The operative eye was identified and dilated.  The operative eye was marked.  Topical anesthesia was administered to the operative eye.     The patient was then to the operative suite and placed in the supine position.  A timeout was performed confirming the patient, procedure to be performed, and all other relevant information.   The patient's face was prepped and draped in the usual fashion for intra-ocular surgery.  A lid speculum was placed into the operative eye and the surgical microscope moved into place and focused.  A superotemporal paracentesis was created using a 20 gauge paracentesis blade.  Shugarcaine was injected into the anterior chamber.  Viscoelastic was injected into the anterior chamber.  A temporal clear-corneal main wound incision was created using a 2.453mmicrokeratome.  A continuous curvilinear capsulorrhexis was initiated using an irrigating cystitome and completed using capsulorrhexis forceps.  Hydrodissection and hydrodeliniation were performed.  Viscoelastic was injected into the anterior chamber.  A phacoemulsification handpiece and a chopper as a second instrument were used to remove the nucleus and epinucleus. The irrigation/aspiration handpiece was used to remove any remaining cortical material.   The capsular bag was  reinflated with viscoelastic, checked, and found to be intact.  The intraocular lens was inserted into the capsular bag and dialed into place using a Kuglen hook.  The irrigation/aspiration handpiece was used to remove any remaining viscoelastic.  The clear corneal wound and paracentesis wounds were then hydrated and checked with Weck-Cels to be watertight.  The lid-speculum and drape was removed, and the patient's face was cleaned with a wet and dry 4x4.  Maxitrol was instilled in the eye before a clear shield was taped over the eye. The patient was taken to the post-operative care unit in good condition, having tolerated the procedure well.  Post-Op Instructions: [facility] and will receive all other orders and instructions.

## 2017-10-07 NOTE — Anesthesia Postprocedure Evaluation (Signed)
Anesthesia Post Note  Patient: Judith Hill  Procedure(s) Performed: CATARACT EXTRACTION PHACO AND INTRAOCULAR LENS PLACEMENT (IOC) (Right Eye)  Patient location during evaluation: PACU Anesthesia Type: MAC Level of consciousness: awake and alert Pain management: pain level controlled Vital Signs Assessment: post-procedure vital signs reviewed and stable Respiratory status: spontaneous breathing, nonlabored ventilation and respiratory function stable Cardiovascular status: blood pressure returned to baseline Postop Assessment: no apparent nausea or vomiting Anesthetic complications: no     Last Vitals:  Vitals:   10/07/17 0632  BP: 110/72  Pulse: 87  Resp: 18  Temp: 36.6 C  SpO2: 97%    Last Pain:  Vitals:   10/07/17 0632  TempSrc: Oral  PainSc: 0-No pain                 Azazel Franze J

## 2017-10-07 NOTE — Transfer of Care (Signed)
Immediate Anesthesia Transfer of Care Note  Patient: Judith Hill  Procedure(s) Performed: CATARACT EXTRACTION PHACO AND INTRAOCULAR LENS PLACEMENT (IOC) (Right Eye)  Patient Location: PACU  Anesthesia Type:MAC  Level of Consciousness: awake and patient cooperative  Airway & Oxygen Therapy: Patient Spontanous Breathing  Post-op Assessment: Report given to RN, Post -op Vital signs reviewed and stable and Patient moving all extremities  Post vital signs: Reviewed and stable  Last Vitals:  Vitals Value Taken Time  BP    Temp    Pulse    Resp    SpO2      Last Pain:  Vitals:   10/07/17 0017  TempSrc: Oral  PainSc: 0-No pain      Patients Stated Pain Goal: 8 (49/44/96 7591)  Complications: No apparent anesthesia complications

## 2017-10-07 NOTE — Anesthesia Preprocedure Evaluation (Signed)
Anesthesia Evaluation  Patient identified by MRN, date of birth, ID band Patient awake    Reviewed: Allergy & Precautions, H&P , NPO status , Patient's Chart, lab work & pertinent test results, reviewed documented beta blocker date and time   Airway Mallampati: II  TM Distance: >3 FB Neck ROM: full    Dental no notable dental hx. (+) Teeth Intact, Dental Advidsory Given   Pulmonary neg pulmonary ROS, COPD, Current Smoker,    Pulmonary exam normal breath sounds clear to auscultation       Cardiovascular Exercise Tolerance: Good negative cardio ROS   Rhythm:regular Rate:Normal     Neuro/Psych Anxiety Depression CVA negative neurological ROS  negative psych ROS   GI/Hepatic negative GI ROS, Neg liver ROS,   Endo/Other  negative endocrine ROSHypothyroidism   Renal/GU negative Renal ROS  negative genitourinary   Musculoskeletal   Abdominal   Peds  Hematology negative hematology ROS (+)   Anesthesia Other Findings Tobacco abuse Obesity 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, 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   Dental Advisory Given  Plan Discussed with: CRNA and Anesthesiologist  Anesthesia Plan Comments:         Anesthesia Quick Evaluation

## 2017-10-07 NOTE — Addendum Note (Signed)
Addendum  created 10/07/17 0914 by Charmaine Downs, CRNA   Charge Capture section accepted

## 2017-10-07 NOTE — H&P (Signed)
The H and P was reviewed and updated. The patient was examined.  No changes were found after exam.  The surgical eye was marked.  

## 2017-10-08 ENCOUNTER — Encounter (HOSPITAL_COMMUNITY): Payer: Self-pay | Admitting: Ophthalmology

## 2017-10-25 ENCOUNTER — Encounter (HOSPITAL_COMMUNITY): Payer: Self-pay

## 2017-10-25 MED ORDER — HYDROCODONE-ACETAMINOPHEN 7.5-325 MG PO TABS: 1.0000 | ORAL_TABLET | Freq: Once | ORAL | Status: DC | PRN

## 2017-10-25 MED ORDER — PROMETHAZINE HCL 25 MG/ML IJ SOLN: 6.2500 mg | INTRAMUSCULAR | Status: DC | PRN

## 2017-10-25 MED ORDER — MEPERIDINE HCL 50 MG/ML IJ SOLN: 6.2500 mg | INTRAMUSCULAR | Status: DC | PRN

## 2017-10-25 MED ORDER — HYDROMORPHONE HCL 1 MG/ML IJ SOLN: 0.2500 mg | INTRAMUSCULAR | Status: DC | PRN

## 2017-10-25 MED ORDER — LACTATED RINGERS IV SOLN: INTRAVENOUS | Status: DC

## 2017-10-26 ENCOUNTER — Encounter (HOSPITAL_COMMUNITY)
Admission: RE | Admit: 2017-10-26 | Discharge: 2017-10-26 | Disposition: A | Discharge status: Home / Self Care | Payer: Medicare Other | Source: Ambulatory Visit | Attending: Ophthalmology | Admitting: Ophthalmology

## 2017-10-29 ENCOUNTER — Encounter (HOSPITAL_COMMUNITY): Payer: Self-pay | Admitting: *Deleted

## 2017-10-29 ENCOUNTER — Ambulatory Visit (HOSPITAL_COMMUNITY)
Admission: RE | Admit: 2017-10-29 | Discharge: 2017-10-29 | Disposition: A | Discharge status: Home / Self Care | Payer: Medicare Other | Source: Ambulatory Visit | Attending: Ophthalmology | Admitting: Ophthalmology

## 2017-10-29 ENCOUNTER — Encounter (HOSPITAL_COMMUNITY)
Admission: RE | Disposition: A | Discharge status: Home / Self Care | Payer: Self-pay | Source: Ambulatory Visit | Attending: Ophthalmology

## 2017-10-29 ENCOUNTER — Ambulatory Visit (HOSPITAL_COMMUNITY): Payer: Medicare Other | Admitting: Anesthesiology

## 2017-10-29 DIAGNOSIS — E669 Obesity, unspecified: Secondary | ICD-10-CM | POA: Diagnosis not present

## 2017-10-29 DIAGNOSIS — Z79899 Other long term (current) drug therapy: Secondary | ICD-10-CM | POA: Insufficient documentation

## 2017-10-29 DIAGNOSIS — F419 Anxiety disorder, unspecified: Secondary | ICD-10-CM | POA: Diagnosis not present

## 2017-10-29 DIAGNOSIS — H269 Unspecified cataract: Secondary | ICD-10-CM | POA: Insufficient documentation

## 2017-10-29 DIAGNOSIS — F17210 Nicotine dependence, cigarettes, uncomplicated: Secondary | ICD-10-CM | POA: Diagnosis not present

## 2017-10-29 HISTORY — PX: CATARACT EXTRACTION W/PHACO: SHX586

## 2017-10-29 SURGERY — PHACOEMULSIFICATION, CATARACT, WITH IOL INSERTION
Anesthesia: Monitor Anesthesia Care | Site: Eye | Laterality: Left

## 2017-10-29 MED ORDER — MIDAZOLAM HCL 2 MG/2ML IJ SOLN
INTRAMUSCULAR | Status: DC | PRN
  Administered 2017-10-29 (×2): 2 mg via INTRAVENOUS

## 2017-10-29 MED ORDER — BSS IO SOLN
INTRAOCULAR | Status: DC | PRN
  Administered 2017-10-29: 15 mL

## 2017-10-29 MED ORDER — PROVISC 10 MG/ML IO SOLN
INTRAOCULAR | Status: DC | PRN
  Administered 2017-10-29: 0.85 mL via INTRAOCULAR

## 2017-10-29 MED ORDER — PHENYLEPHRINE HCL 2.5 % OP SOLN
1.0000 [drp] | OPHTHALMIC | Status: AC
  Administered 2017-10-29 (×3): 1 [drp] via OPHTHALMIC

## 2017-10-29 MED ORDER — TETRACAINE HCL 0.5 % OP SOLN
1.0000 [drp] | OPHTHALMIC | Status: AC
  Administered 2017-10-29 (×3): 1 [drp] via OPHTHALMIC

## 2017-10-29 MED ORDER — NEOMYCIN-POLYMYXIN-DEXAMETH 3.5-10000-0.1 OP SUSP
OPHTHALMIC | Status: DC | PRN
  Administered 2017-10-29: 2 [drp] via OPHTHALMIC

## 2017-10-29 MED ORDER — LIDOCAINE HCL (PF) 1 % IJ SOLN
INTRAOCULAR | Status: DC | PRN
  Administered 2017-10-29: .6 mL via OPHTHALMIC

## 2017-10-29 MED ORDER — LACTATED RINGERS IV SOLN
INTRAVENOUS | Status: DC
  Administered 2017-10-29: 1000 mL via INTRAVENOUS

## 2017-10-29 MED ORDER — CYCLOPENTOLATE-PHENYLEPHRINE 0.2-1 % OP SOLN
1.0000 [drp] | OPHTHALMIC | Status: AC
  Administered 2017-10-29 (×3): 1 [drp] via OPHTHALMIC

## 2017-10-29 MED ORDER — MIDAZOLAM HCL 2 MG/2ML IJ SOLN
INTRAMUSCULAR | Status: AC
  Filled 2017-10-29: qty 2

## 2017-10-29 MED ORDER — SODIUM HYALURONATE 23 MG/ML IO SOLN
INTRAOCULAR | Status: DC | PRN
  Administered 2017-10-29: 0.6 mL via INTRAOCULAR

## 2017-10-29 MED ORDER — EPINEPHRINE PF 1 MG/ML IJ SOLN
INTRAOCULAR | Status: DC | PRN
  Administered 2017-10-29: 500 mL

## 2017-10-29 MED ORDER — LIDOCAINE HCL 3.5 % OP GEL
1.0000 "application " | Freq: Once | OPHTHALMIC | Status: AC
  Administered 2017-10-29: 1 via OPHTHALMIC

## 2017-10-29 MED ORDER — POVIDONE-IODINE 5 % OP SOLN
OPHTHALMIC | Status: DC | PRN
  Administered 2017-10-29: 1 via OPHTHALMIC

## 2017-10-29 SURGICAL SUPPLY — 13 items
CLOTH BEACON ORANGE TIMEOUT ST (SAFETY) ×2 IMPLANT
EYE SHIELD UNIVERSAL CLEAR (GAUZE/BANDAGES/DRESSINGS) ×2 IMPLANT
GLOVE BIOGEL PI IND STRL 7.0 (GLOVE) IMPLANT
GLOVE BIOGEL PI INDICATOR 7.0 (GLOVE) ×4
LENS ALC ACRYL/TECN (Ophthalmic Related) ×2 IMPLANT
NDL HYPO 18GX1.5 BLUNT FILL (NEEDLE) IMPLANT
NEEDLE HYPO 18GX1.5 BLUNT FILL (NEEDLE) ×3 IMPLANT
PAD ARMBOARD 7.5X6 YLW CONV (MISCELLANEOUS) ×2 IMPLANT
SYR TB 1ML LL NO SAFETY (SYRINGE) ×2 IMPLANT
TAPE SURG TRANSPORE 1 IN (GAUZE/BANDAGES/DRESSINGS) IMPLANT
TAPE SURGICAL TRANSPORE 1 IN (GAUZE/BANDAGES/DRESSINGS) ×2
VISCOELASTIC ADDITIONAL (OPHTHALMIC RELATED) ×2 IMPLANT
WATER STERILE IRR 1000ML POUR (IV SOLUTION) ×2 IMPLANT

## 2017-10-29 NOTE — Op Note (Signed)
Date of procedure: 10/29/17  Pre-operative diagnosis: Visually significant cataract, Left Eye (H25.812)  Post-operative diagnosis: Visually significant cataract, Left Eye  Procedure: Removal of cataract via phacoemulsification and insertion of intra-ocular lens [employer] PCB00  +21.0D into the capsular bag of the Left Eye  Attending surgeon: Gerda Diss. Shawntae Lowy, MD, MA  Anesthesia: MAC, Topical Akten  Complications: None  Estimated Blood Loss: <21m (minimal)  Specimens: None  Implants: As above  Indications:  Visually significant cataract, Left Eye  Procedure:  The patient was seen and identified in the pre-operative area. The operative eye was identified and dilated.  The operative eye was marked.  Topical anesthesia was administered to the operative eye.     The patient was then to the operative suite and placed in the supine position.  A timeout was performed confirming the patient, procedure to be performed, and all other relevant information.   The patient's face was prepped and draped in the usual fashion for intra-ocular surgery.  A lid speculum was placed into the operative eye and the surgical microscope moved into place and focused.  An inferotemporal paracentesis was created using a 20 gauge paracentesis blade.  Shugarcaine was injected into the anterior chamber.  Viscoelastic was injected into the anterior chamber.  A temporal clear-corneal main wound incision was created using a 2.433mmicrokeratome.  A continuous curvilinear capsulorrhexis was initiated using an irrigating cystitome and completed using capsulorrhexis forceps.  Hydrodissection and hydrodeliniation were performed.  Viscoelastic was injected into the anterior chamber.  A phacoemulsification handpiece and a chopper as a second instrument were used to remove the nucleus and epinucleus. The irrigation/aspiration handpiece was used to remove any remaining cortical material.   The capsular bag was  reinflated with viscoelastic, checked, and found to be intact.  The intraocular lens was inserted into the capsular bag and dialed into place using a Kuglen hook.  The irrigation/aspiration handpiece was used to remove any remaining viscoelastic.  The clear corneal wound and paracentesis wounds were then hydrated and checked with Weck-Cels to be watertight.  The lid-speculum and drape was removed, and the patient's face was cleaned with a wet and dry 4x4.  Maxitrol was instilled in the eye before a clear shield was taped over the eye. The patient was taken to the post-operative care unit in good condition, having tolerated the procedure well.  Post-Op Instructions: [facility] and will receive all other orders and instructions.

## 2017-10-29 NOTE — Anesthesia Preprocedure Evaluation (Signed)
Anesthesia Evaluation  Patient identified by MRN, date of birth, ID band Patient awake    Reviewed: Allergy & Precautions, H&P , NPO status , Patient's Chart, lab work & pertinent test results  Airway Mallampati: II  TM Distance: >3 FB Neck ROM: full    Dental no notable dental hx. (+) Teeth Intact, Dental Advidsory Given   Pulmonary neg pulmonary ROS, COPD, Current Smoker,    Pulmonary exam normal breath sounds clear to auscultation       Cardiovascular Exercise Tolerance: Good negative cardio ROS   Rhythm:regular Rate:Normal     Neuro/Psych PSYCHIATRIC DISORDERS Anxiety Depression CVA negative neurological ROS  negative psych ROS   GI/Hepatic negative GI ROS, Neg liver ROS,   Endo/Other  negative endocrine ROSHypothyroidism   Renal/GU negative Renal ROS  negative genitourinary   Musculoskeletal   Abdominal   Peds  Hematology negative hematology ROS (+)   Anesthesia Other Findings Tobacco abuse Obesity 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, 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   Dental Advisory Given  Plan Discussed with: CRNA  Anesthesia Plan Comments:         Anesthesia Quick Evaluation

## 2017-10-29 NOTE — Transfer of Care (Signed)
Immediate Anesthesia Transfer of Care Note  Patient: Judith Hill  Procedure(s) Performed: CATARACT EXTRACTION PHACO AND INTRAOCULAR LENS PLACEMENT LEFT EYE CDE=4.77 (Left Eye)  Patient Location: Short Stay  Anesthesia Type:MAC  Level of Consciousness: awake, alert  and patient cooperative  Airway & Oxygen Therapy: Patient Spontanous Breathing  Post-op Assessment: Report given to RN and Post -op Vital signs reviewed and stable  Post vital signs: Reviewed and stable  Last Vitals:  Vitals Value Taken Time  BP    Temp    Pulse    Resp    SpO2      Last Pain:  Vitals:   10/29/17 0830  TempSrc: Oral  PainSc: 0-No pain      Patients Stated Pain Goal: 8 (90/22/84 0698)  Complications: No apparent anesthesia complications

## 2017-10-29 NOTE — H&P (Signed)
The H and P was reviewed and updated. The patient was examined.  No changes were found after exam.  The surgical eye was marked.  

## 2017-10-29 NOTE — Discharge Instructions (Addendum)
Please discharge patient when stable, will follow up today with Dr. Wrzosek at the [facility] office immediately following discharge.  Leave shield in place until visit.  All paperwork with discharge instructions will be given at the office. ° ° °Monitored Anesthesia Care, Care After °These instructions provide you with information about caring for yourself after your procedure. Your health care provider may also give you more specific instructions. Your treatment has been planned according to current medical practices, but problems sometimes occur. Call your health care provider if you have any problems or questions after your procedure. °What can I expect after the procedure? °After your procedure, it is common to: °· Feel sleepy for several hours. °· Feel clumsy and have poor balance for several hours. °· Feel forgetful about what happened after the procedure. °· Have poor judgment for several hours. °· Feel nauseous or vomit. °· Have a sore throat if you had a breathing tube during the procedure. ° °Follow these instructions at home: °For at least 24 hours after the procedure: ° °· Do not: °? Participate in activities in which you could fall or become injured. °? Drive. °? Use heavy machinery. °? Drink alcohol. °? Take sleeping pills or medicines that cause drowsiness. °? Make important decisions or sign legal documents. °? Take care of children on your own. °· Rest. °Eating and drinking °· Follow the diet that is recommended by your health care provider. °· If you vomit, drink water, juice, or soup when you can drink without vomiting. °· Make sure you have little or no nausea before eating solid foods. °General instructions °· Have a responsible adult stay with you until you are awake and alert. °· Take over-the-counter and prescription medicines only as told by your health care provider. °· If you smoke, do not smoke without supervision. °· Keep all follow-up visits as told by your health care  provider. This is important. °Contact a health care provider if: °· You keep feeling nauseous or you keep vomiting. °· You feel light-headed. °· You develop a rash. °· You have a fever. °Get help right away if: °· You have trouble breathing. °This information is not intended to replace advice given to you by your health care provider. Make sure you discuss any questions you have with your health care provider. °Document Released: 04/21/2015 Document Revised: 08/21/2015 Document Reviewed: 04/21/2015 °Elsevier Interactive Patient Education © 2018 [employer] ° °

## 2017-10-29 NOTE — Anesthesia Procedure Notes (Signed)
Procedure Name: MAC Date/Time: 10/29/2017 9:36 AM Performed by: Vista Deck, CRNA Pre-anesthesia Checklist: Patient identified, Emergency Drugs available, Suction available, Timeout performed and Patient being monitored Patient Re-evaluated:Patient Re-evaluated prior to induction Oxygen Delivery Method: Nasal Cannula

## 2017-10-29 NOTE — Anesthesia Postprocedure Evaluation (Signed)
Anesthesia Post Note  Patient: Judith Hill  Procedure(s) Performed: CATARACT EXTRACTION PHACO AND INTRAOCULAR LENS PLACEMENT LEFT EYE CDE=4.77 (Left Eye)  Patient location during evaluation: Short Stay Anesthesia Type: MAC Level of consciousness: awake and alert and patient cooperative Pain management: satisfactory to patient Vital Signs Assessment: post-procedure vital signs reviewed and stable Respiratory status: spontaneous breathing Cardiovascular status: stable Postop Assessment: no apparent nausea or vomiting Anesthetic complications: no     Last Vitals:  Vitals:   10/29/17 0935 10/29/17 1003  BP:  111/85  Pulse:  87  Resp: 12 16  Temp:  36.4 C  SpO2: 98% 97%    Last Pain:  Vitals:   10/29/17 1003  TempSrc: Oral  PainSc: 3                  Duanne Duchesne

## 2017-11-01 ENCOUNTER — Encounter (HOSPITAL_COMMUNITY): Payer: Self-pay | Admitting: Ophthalmology

## 2018-04-15 ENCOUNTER — Other Ambulatory Visit: Payer: Self-pay | Admitting: Physician Assistant

## 2018-04-15 ENCOUNTER — Ambulatory Visit
Admission: RE | Admit: 2018-04-15 | Discharge: 2018-04-15 | Disposition: A | Discharge status: Home / Self Care | Payer: Medicare Other | Source: Ambulatory Visit | Attending: Physician Assistant | Admitting: Physician Assistant

## 2018-04-15 DIAGNOSIS — R0602 Shortness of breath: Secondary | ICD-10-CM

## 2018-08-22 ENCOUNTER — Encounter: Payer: Self-pay | Admitting: Gastroenterology

## 2018-10-05 ENCOUNTER — Encounter: Payer: Self-pay | Admitting: Gastroenterology

## 2018-10-18 ENCOUNTER — Other Ambulatory Visit: Payer: Self-pay

## 2018-10-18 ENCOUNTER — Ambulatory Visit (AMBULATORY_SURGERY_CENTER): Payer: Self-pay | Admitting: *Deleted

## 2018-10-18 VITALS — Temp 97.3°F | Ht 66.0 in | Wt 228.0 lb

## 2018-10-18 DIAGNOSIS — Z86010 Personal history of colonic polyps: Secondary | ICD-10-CM

## 2018-10-18 MED ORDER — SUPREP BOWEL PREP KIT 17.5-3.13-1.6 GM/177ML PO SOLN: 1.0000 | Freq: Once | ORAL | 0 refills | Status: AC

## 2018-10-18 NOTE — Progress Notes (Signed)
No egg or soy allergy known to patient  No issues with past sedation with any surgeries  or procedures, no intubation problems  No diet pills per patient No home 02 use per patient  No blood thinners per patient  Pt states  issues with constipation - occ goes 1 week with out BM- hard stools - will do a 2 day  Prep due to this  No A fib or A flutter  EMMI video sent to pt's e mail   [place] [id number] EXP 05/22 - AS DIRECTED   Due to the COVID-19 pandemic we are asking patients to follow these guidelines. Please only bring one care partner. Please be aware that your care partner may wait in the car in the parking lot or if they feel like they will be too hot to wait in the car, they may wait in the lobby on the 4th floor. All care partners are required to wear a mask the entire time (we do not have any that we can provide them), they need to practice social distancing, and we will do a Covid check for all patient's and care partners when you arrive. Also we will check their temperature and your temperature. If the care partner waits in their car they need to stay in the parking lot the entire time and we will call them on their cell phone when the patient is ready for discharge so they can bring the car to the front of the building. Also all patient's will need to wear a mask into building.

## 2018-10-20 ENCOUNTER — Encounter: Payer: Self-pay | Admitting: Gastroenterology

## 2018-10-31 ENCOUNTER — Telehealth: Payer: Self-pay

## 2018-10-31 NOTE — Telephone Encounter (Signed)
Covid-19 screening questions   Do you now or have you had a fever in the last 14 days? NO   Do you have any respiratory symptoms of shortness of breath or cough now or in the last 14 days? NO  Do you have any family members or close contacts with diagnosed or suspected Covid-19 in the past 14 days? NO  Have you been tested for Covid-19 and found to be positive? NO        

## 2018-11-01 ENCOUNTER — Other Ambulatory Visit: Payer: Self-pay

## 2018-11-01 ENCOUNTER — Ambulatory Visit (AMBULATORY_SURGERY_CENTER): Payer: Medicare Other | Admitting: Gastroenterology

## 2018-11-01 ENCOUNTER — Encounter: Payer: Self-pay | Admitting: Gastroenterology

## 2018-11-01 VITALS — BP 115/70 | HR 83 | Temp 98.0°F | Resp 18 | Ht 66.0 in | Wt 228.0 lb

## 2018-11-01 DIAGNOSIS — Z86010 Personal history of colonic polyps: Secondary | ICD-10-CM | POA: Diagnosis not present

## 2018-11-01 DIAGNOSIS — D124 Benign neoplasm of descending colon: Secondary | ICD-10-CM | POA: Diagnosis not present

## 2018-11-01 DIAGNOSIS — D120 Benign neoplasm of cecum: Secondary | ICD-10-CM | POA: Diagnosis not present

## 2018-11-01 DIAGNOSIS — D122 Benign neoplasm of ascending colon: Secondary | ICD-10-CM | POA: Diagnosis not present

## 2018-11-01 MED ORDER — SODIUM CHLORIDE 0.9 % IV SOLN: 500.0000 mL | Freq: Once | INTRAVENOUS | Status: DC

## 2018-11-01 NOTE — Patient Instructions (Signed)
Discharge instructions given. Handouts on polyps,diverticulosis,hemorrhoids and a high fiber diet. Resume previous medications. YOU HAD AN ENDOSCOPIC PROCEDURE TODAY AT [facility]:   Refer to the procedure report that was given to you for any specific questions about what was found during the examination.  If the procedure report does not answer your questions, please call your gastroenterologist to clarify.  If you requested that your care partner not be given the details of your procedure findings, then the procedure report has been included in a sealed envelope for you to review at your convenience later.  YOU SHOULD EXPECT: Some feelings of bloating in the abdomen. Passage of more gas than usual.  Walking can help get rid of the air that was put into your GI tract during the procedure and reduce the bloating. If you had a lower endoscopy (such as a colonoscopy or flexible sigmoidoscopy) you may notice spotting of blood in your stool or on the toilet paper. If you underwent a bowel prep for your procedure, you may not have a normal bowel movement for a few days.  Please Note:  You might notice some irritation and congestion in your nose or some drainage.  This is from the oxygen used during your procedure.  There is no need for concern and it should clear up in a day or so.  SYMPTOMS TO REPORT IMMEDIATELY:   Following lower endoscopy (colonoscopy or flexible sigmoidoscopy):  Excessive amounts of blood in the stool  Significant tenderness or worsening of abdominal pains  Swelling of the abdomen that is new, acute  Fever of 100F or higher  For urgent or emergent issues, a gastroenterologist can be reached at any hour by calling [phone].   DIET:  We do recommend a small meal at first, but then you may proceed to your regular diet.  Drink plenty of fluids but you should avoid alcoholic beverages for 24 hours.  ACTIVITY:  You should plan to take it easy for the rest of  today and you should NOT DRIVE or use heavy machinery until tomorrow (because of the sedation medicines used during the test).    FOLLOW UP: Our staff will call the number listed on your records 48-72 hours following your procedure to check on you and address any questions or concerns that you may have regarding the information given to you following your procedure. If we do not reach you, we will leave a message.  We will attempt to reach you two times.  During this call, we will ask if you have developed any symptoms of COVID 19. If you develop any symptoms (ie: fever, flu-like symptoms, shortness of breath, cough etc.) before then, please call [phone].  If you test positive for Covid 19 in the 2 weeks post procedure, please call and report this information to [place].    If any biopsies were taken you will be contacted by phone or by letter within the next 1-3 weeks.  Please call us at [phone] if you have not heard about the biopsies in 3 weeks.    SIGNATURES/CONFIDENTIALITY: You and/or your care partner have signed paperwork which will be entered into your electronic medical record.  These signatures attest to the fact that that the information above on your After Visit Summary has been reviewed and is understood.  Full responsibility of the confidentiality of this discharge information lies with you and/or your care-partner.

## 2018-11-01 NOTE — Op Note (Signed)
[facility] Patient Name: Judith Hill Procedure Date: 11/01/2018 7:22 AM MRN: [id number] Endoscopist: Ladene Artist , MD Age: 68 Referring MD:  Date of Birth: 1950-07-09 Gender: Female Account #: [id number] Procedure:                Colonoscopy Indications:              High risk colon cancer surveillance: Personal                            history of sessile serrated colon polyp (10 mm or                            greater in size) Medicines:                Monitored Anesthesia Care Procedure:                Pre-Anesthesia Assessment:                           - Prior to the procedure, a History and Physical                            was performed, and patient medications and                            allergies were reviewed. The patient's tolerance of                            previous anesthesia was also reviewed. The risks                            and benefits of the procedure and the sedation                            options and risks were discussed with the patient.                            All questions were answered, and informed consent                            was obtained. Prior Anticoagulants: The patient has                            taken no previous anticoagulant or antiplatelet                            agents. ASA Grade Assessment: III - A patient with                            severe systemic disease. After reviewing the risks                            and benefits, the patient was deemed in  satisfactory condition to undergo the procedure.                           After obtaining informed consent, the colonoscope                            was passed under direct vision. Throughout the                            procedure, the patient's blood pressure, pulse, and                            oxygen saturations were monitored continuously. The                            Colonoscope was introduced through the  anus and                            advanced to the the cecum, identified by                            appendiceal orifice and ileocecal valve. The                            ileocecal valve, appendiceal orifice, and rectum                            were photographed. The quality of the bowel                            preparation was good. The colonoscopy was performed                            without difficulty. The patient tolerated the                            procedure well. Scope In: 8:37:54 AM Scope Out: 8:57:28 AM Scope Withdrawal Time: 0 hours 16 minutes 58 seconds  Total Procedure Duration: 0 hours 19 minutes 34 seconds  Findings:                 The perianal and digital rectal examinations were                            normal.                           Seven sessile polyps were found in the descending                            colon (2), ascending colon (3), cecum (1) and                            ileocecal valve (1). The polyps were 6 to 9 mm in  size. These polyps were removed with a cold snare.                            Resection and retrieval were complete.                           Multiple small-mouthed diverticula were found in                            the left colon. There was no evidence of                            diverticular bleeding.                           Internal hemorrhoids were found during                            retroflexion. The hemorrhoids were small and Grade                            I (internal hemorrhoids that do not prolapse).                           The exam was otherwise without abnormality on                            direct and retroflexion views. Complications:            No immediate complications. Estimated blood loss:                            None. Estimated Blood Loss:     Estimated blood loss: none. Impression:               - Seven 6 to 9 mm polyps in the descending colon,                             in the ascending colon, in the cecum and at the                            ileocecal valve, removed with a cold snare.                            Resected and retrieved.                           - Mild diverticulosis in the left colon.                           - Internal hemorrhoids.                           - The examination was otherwise normal on direct  and retroflexion views. Recommendation:           - Repeat colonoscopy date to be determined, likely                            3 years, after pending pathology results are                            reviewed for surveillance.                           - Patient has a contact number available for                            emergencies. The signs and symptoms of potential                            delayed complications were discussed with the                            patient. Return to normal activities tomorrow.                            Written discharge instructions were provided to the                            patient.                           - High fiber diet.                           - Continue present medications.                           - Await pathology results. Ladene Artist, MD 11/01/2018 9:04:36 AM This report has been signed electronically.

## 2018-11-01 NOTE — Progress Notes (Signed)
Temp check by JB Vital check by [place]  Patient states no changes to medical or surgical history since pre-visit screening on 10/18/2018.

## 2018-11-01 NOTE — Progress Notes (Signed)
PT taken to PACU. Monitors in place. VSS. Report given to RN. 

## 2018-11-03 ENCOUNTER — Telehealth: Payer: Self-pay

## 2018-11-03 NOTE — Telephone Encounter (Signed)
Covid-19 screening questions   Do you now or have you had a fever in the last 14 days? No.  Do you have any respiratory symptoms of shortness of breath or cough now or in the last 14 days? No. Do you have any family members or close contacts with diagnosed or suspected Covid-19 in the past 14 days? No.  Have you been tested for Covid-19 and found to be positive? No.       Follow up Call-  Call back number 11/01/2018  Post procedure Call Back phone  # [phone]  Permission to leave phone message Yes  Some recent data might be hidden     Patient questions:  Do you have a fever, pain , or abdominal swelling? No. Pain Score  0 *  Have you tolerated food without any problems? Yes.    Have you been able to return to your normal activities? Yes.    Do you have any questions about your discharge instructions: Diet   No. Medications  No. Follow up visit  No.  Do you have questions or concerns about your Care? No.  Actions: * If pain score is 4 or above: No action needed, pain <4.

## 2018-11-07 ENCOUNTER — Encounter: Payer: Self-pay | Admitting: Gastroenterology

## 2020-02-11 ENCOUNTER — Observation Stay (HOSPITAL_BASED_OUTPATIENT_CLINIC_OR_DEPARTMENT_OTHER): Payer: Medicare Other

## 2020-02-11 ENCOUNTER — Encounter (HOSPITAL_COMMUNITY): Payer: Self-pay | Admitting: Emergency Medicine

## 2020-02-11 ENCOUNTER — Emergency Department (HOSPITAL_COMMUNITY): Payer: Medicare Other

## 2020-02-11 ENCOUNTER — Other Ambulatory Visit: Payer: Self-pay

## 2020-02-11 ENCOUNTER — Observation Stay (HOSPITAL_COMMUNITY)
Admission: EM | Admit: 2020-02-11 | Discharge: 2020-02-12 | Disposition: A | Discharge status: Home / Self Care | Payer: Medicare Other | Attending: Internal Medicine | Admitting: Internal Medicine

## 2020-02-11 ENCOUNTER — Observation Stay (HOSPITAL_COMMUNITY): Payer: Medicare Other

## 2020-02-11 DIAGNOSIS — E669 Obesity, unspecified: Secondary | ICD-10-CM | POA: Diagnosis not present

## 2020-02-11 DIAGNOSIS — R531 Weakness: Secondary | ICD-10-CM

## 2020-02-11 DIAGNOSIS — J45909 Unspecified asthma, uncomplicated: Secondary | ICD-10-CM | POA: Diagnosis not present

## 2020-02-11 DIAGNOSIS — E039 Hypothyroidism, unspecified: Secondary | ICD-10-CM | POA: Diagnosis not present

## 2020-02-11 DIAGNOSIS — Z79899 Other long term (current) drug therapy: Secondary | ICD-10-CM | POA: Insufficient documentation

## 2020-02-11 DIAGNOSIS — E785 Hyperlipidemia, unspecified: Secondary | ICD-10-CM | POA: Diagnosis not present

## 2020-02-11 DIAGNOSIS — I6389 Other cerebral infarction: Secondary | ICD-10-CM

## 2020-02-11 DIAGNOSIS — I679 Cerebrovascular disease, unspecified: Secondary | ICD-10-CM | POA: Insufficient documentation

## 2020-02-11 DIAGNOSIS — R270 Ataxia, unspecified: Secondary | ICD-10-CM | POA: Diagnosis not present

## 2020-02-11 DIAGNOSIS — F17210 Nicotine dependence, cigarettes, uncomplicated: Secondary | ICD-10-CM | POA: Insufficient documentation

## 2020-02-11 DIAGNOSIS — Z86010 Personal history of colonic polyps: Secondary | ICD-10-CM | POA: Insufficient documentation

## 2020-02-11 DIAGNOSIS — Z7982 Long term (current) use of aspirin: Secondary | ICD-10-CM | POA: Diagnosis not present

## 2020-02-11 DIAGNOSIS — J449 Chronic obstructive pulmonary disease, unspecified: Secondary | ICD-10-CM | POA: Diagnosis not present

## 2020-02-11 DIAGNOSIS — Z20822 Contact with and (suspected) exposure to covid-19: Secondary | ICD-10-CM | POA: Insufficient documentation

## 2020-02-11 DIAGNOSIS — E041 Nontoxic single thyroid nodule: Secondary | ICD-10-CM

## 2020-02-11 DIAGNOSIS — G8190 Hemiplegia, unspecified affecting unspecified side: Secondary | ICD-10-CM | POA: Diagnosis not present

## 2020-02-11 DIAGNOSIS — G8191 Hemiplegia, unspecified affecting right dominant side: Secondary | ICD-10-CM | POA: Diagnosis not present

## 2020-02-11 DIAGNOSIS — I639 Cerebral infarction, unspecified: Secondary | ICD-10-CM

## 2020-02-11 DIAGNOSIS — I10 Essential (primary) hypertension: Secondary | ICD-10-CM | POA: Diagnosis not present

## 2020-02-11 LAB — CBC
HCT: 46 % (ref 36.0–46.0)
Hemoglobin: 14.9 g/dL (ref 12.0–15.0)
MCH: 31.4 pg (ref 26.0–34.0)
MCHC: 32.4 g/dL (ref 30.0–36.0)
MCV: 96.8 fL (ref 80.0–100.0)
Platelets: 228 10*3/uL (ref 150–400)
RBC: 4.75 MIL/uL (ref 3.87–5.11)
RDW: 14.9 % (ref 11.5–15.5)
WBC: 10.1 10*3/uL (ref 4.0–10.5)
nRBC: 0 % (ref 0.0–0.2)

## 2020-02-11 LAB — VITAMIN B12: Vitamin B-12: 313 pg/mL (ref 180–914)

## 2020-02-11 LAB — COMPREHENSIVE METABOLIC PANEL
ALT: 59 U/L — ABNORMAL HIGH (ref 0–44)
AST: 31 U/L (ref 15–41)
Albumin: 4.3 g/dL (ref 3.5–5.0)
Alkaline Phosphatase: 85 U/L (ref 38–126)
Anion gap: 10 (ref 5–15)
BUN: 23 mg/dL (ref 8–23)
CO2: 27 mmol/L (ref 22–32)
Calcium: 9.5 mg/dL (ref 8.9–10.3)
Chloride: 99 mmol/L (ref 98–111)
Creatinine, Ser: 1.04 mg/dL — ABNORMAL HIGH (ref 0.44–1.00)
GFR, Estimated: 59 mL/min — ABNORMAL LOW (ref >60)
Glucose, Bld: 90 mg/dL (ref 70–99)
Potassium: 3.2 mmol/L — ABNORMAL LOW (ref 3.5–5.1)
Sodium: 136 mmol/L (ref 135–145)
Total Bilirubin: 0.6 mg/dL (ref 0.3–1.2)
Total Protein: 7.5 g/dL (ref 6.5–8.1)

## 2020-02-11 LAB — URINALYSIS, ROUTINE W REFLEX MICROSCOPIC
Bilirubin Urine: NEGATIVE
Glucose, UA: NEGATIVE mg/dL
Hgb urine dipstick: NEGATIVE
Ketones, ur: NEGATIVE mg/dL
Leukocytes,Ua: NEGATIVE
Nitrite: NEGATIVE
Protein, ur: NEGATIVE mg/dL
Specific Gravity, Urine: 1.017 (ref 1.005–1.030)
pH: 5 (ref 5.0–8.0)

## 2020-02-11 LAB — ECHOCARDIOGRAM COMPLETE
Area-P 1/2: 2.95 cm2
Calc EF: 54.9 %
Height: 66 in
S' Lateral: 3.69 cm
Single Plane A2C EF: 52.8 %
Single Plane A4C EF: 55.9 %
Weight: 3648 oz

## 2020-02-11 LAB — DIFFERENTIAL
Abs Immature Granulocytes: 0.04 10*3/uL (ref 0.00–0.07)
Basophils Absolute: 0.1 10*3/uL (ref 0.0–0.1)
Basophils Relative: 1 %
Eosinophils Absolute: 0.1 10*3/uL (ref 0.0–0.5)
Eosinophils Relative: 1 %
Immature Granulocytes: 0 %
Lymphocytes Relative: 23 %
Lymphs Abs: 2.3 10*3/uL (ref 0.7–4.0)
Monocytes Absolute: 0.8 10*3/uL (ref 0.1–1.0)
Monocytes Relative: 8 %
Neutro Abs: 6.8 10*3/uL (ref 1.7–7.7)
Neutrophils Relative %: 67 %

## 2020-02-11 LAB — ETHANOL: Alcohol, Ethyl (B): <10 mg/dL (ref <10)

## 2020-02-11 LAB — RAPID URINE DRUG SCREEN, HOSP PERFORMED
Amphetamines: NOT DETECTED
Barbiturates: NOT DETECTED
Benzodiazepines: POSITIVE — AB
Cocaine: NOT DETECTED
Opiates: NOT DETECTED
Tetrahydrocannabinol: NOT DETECTED

## 2020-02-11 LAB — CBG MONITORING, ED: Glucose-Capillary: 82 mg/dL (ref 70–99)

## 2020-02-11 LAB — SARS CORONAVIRUS 2 BY RT PCR (HOSPITAL ORDER, PERFORMED IN ~~LOC~~ HOSPITAL LAB): SARS Coronavirus 2: NEGATIVE

## 2020-02-11 LAB — FOLATE: Folate: 14.6 ng/mL (ref >5.9)

## 2020-02-11 LAB — HIV ANTIBODY (ROUTINE TESTING W REFLEX): HIV Screen 4th Generation wRfx: NONREACTIVE

## 2020-02-11 LAB — APTT: aPTT: 26 seconds (ref 24–36)

## 2020-02-11 LAB — PROTIME-INR
INR: 1 (ref 0.8–1.2)
Prothrombin Time: 12.7 seconds (ref 11.4–15.2)

## 2020-02-11 LAB — TSH: TSH: 4.236 u[IU]/mL (ref 0.350–4.500)

## 2020-02-11 MED ORDER — MECLIZINE HCL 12.5 MG PO TABS
25.0000 mg | ORAL_TABLET | Freq: Two times a day (BID) | ORAL | Status: DC | PRN
  Administered 2020-02-11: 25 mg via ORAL
  Filled 2020-02-11: qty 2

## 2020-02-11 MED ORDER — ALPRAZOLAM 0.5 MG PO TABS
0.5000 mg | ORAL_TABLET | Freq: Three times a day (TID) | ORAL | Status: DC | PRN
  Administered 2020-02-11: 1 mg via ORAL
  Filled 2020-02-11: qty 2

## 2020-02-11 MED ORDER — ACETAMINOPHEN 325 MG PO TABS
650.0000 mg | ORAL_TABLET | ORAL | Status: DC | PRN
  Administered 2020-02-12: 650 mg via ORAL
  Filled 2020-02-11: qty 2

## 2020-02-11 MED ORDER — SENNOSIDES-DOCUSATE SODIUM 8.6-50 MG PO TABS: 1.0000 | ORAL_TABLET | Freq: Every evening | ORAL | Status: DC | PRN

## 2020-02-11 MED ORDER — VITAMIN E 45 MG (100 UNIT) PO CAPS
330.0000 [IU] | ORAL_CAPSULE | Freq: Every day | ORAL | Status: DC
  Filled 2020-02-11 (×3): qty 3

## 2020-02-11 MED ORDER — SODIUM CHLORIDE 0.9 % IV SOLN: INTRAVENOUS | Status: DC

## 2020-02-11 MED ORDER — IPRATROPIUM-ALBUTEROL 0.5-2.5 (3) MG/3ML IN SOLN: 3.0000 mL | Freq: Four times a day (QID) | RESPIRATORY_TRACT | Status: DC | PRN

## 2020-02-11 MED ORDER — MOMETASONE FURO-FORMOTEROL FUM 200-5 MCG/ACT IN AERO
2.0000 | INHALATION_SPRAY | Freq: Two times a day (BID) | RESPIRATORY_TRACT | Status: DC
  Administered 2020-02-11 – 2020-02-12 (×2): 2 via RESPIRATORY_TRACT
  Filled 2020-02-11: qty 8.8

## 2020-02-11 MED ORDER — ADULT MULTIVITAMIN W/MINERALS CH
1.0000 | ORAL_TABLET | Freq: Every day | ORAL | Status: DC
  Administered 2020-02-11 – 2020-02-12 (×2): 1 via ORAL
  Filled 2020-02-11 (×2): qty 1

## 2020-02-11 MED ORDER — ACETAMINOPHEN 160 MG/5ML PO SOLN: 650.0000 mg | ORAL | Status: DC | PRN

## 2020-02-11 MED ORDER — LEVOTHYROXINE SODIUM 50 MCG PO TABS
25.0000 ug | ORAL_TABLET | Freq: Every day | ORAL | Status: DC
  Administered 2020-02-12: 25 ug via ORAL
  Filled 2020-02-11 (×2): qty 1

## 2020-02-11 MED ORDER — COQ10 100 MG PO CAPS: 100.0000 mg | ORAL_CAPSULE | Freq: Every day | ORAL | Status: DC

## 2020-02-11 MED ORDER — ASPIRIN EC 325 MG PO TBEC
325.0000 mg | DELAYED_RELEASE_TABLET | Freq: Every day | ORAL | Status: DC
  Administered 2020-02-11 – 2020-02-12 (×2): 325 mg via ORAL
  Filled 2020-02-11 (×3): qty 1

## 2020-02-11 MED ORDER — ENOXAPARIN SODIUM 40 MG/0.4ML ~~LOC~~ SOLN
40.0000 mg | SUBCUTANEOUS | Status: DC
  Administered 2020-02-11: 40 mg via SUBCUTANEOUS
  Filled 2020-02-11: qty 0.4

## 2020-02-11 MED ORDER — IOHEXOL 350 MG/ML SOLN
75.0000 mL | Freq: Once | INTRAVENOUS | Status: AC | PRN
  Administered 2020-02-11: 75 mL via INTRAVENOUS

## 2020-02-11 MED ORDER — STROKE: EARLY STAGES OF RECOVERY BOOK
Freq: Once | Status: AC
  Filled 2020-02-11: qty 1

## 2020-02-11 MED ORDER — ASCORBIC ACID 500 MG PO TABS
500.0000 mg | ORAL_TABLET | Freq: Every day | ORAL | Status: DC
  Administered 2020-02-11 – 2020-02-12 (×2): 500 mg via ORAL
  Filled 2020-02-11 (×2): qty 1

## 2020-02-11 MED ORDER — ACETAMINOPHEN 650 MG RE SUPP: 650.0000 mg | RECTAL | Status: DC | PRN

## 2020-02-11 MED ORDER — BUPROPION HCL ER (XL) 150 MG PO TB24
300.0000 mg | ORAL_TABLET | Freq: Every day | ORAL | Status: DC
  Administered 2020-02-11 – 2020-02-12 (×2): 300 mg via ORAL
  Filled 2020-02-11 (×2): qty 2

## 2020-02-11 NOTE — ED Triage Notes (Signed)
Pt c/o of right sided weakness, facial weakness, loss of balance x 4 days

## 2020-02-11 NOTE — ED Notes (Signed)
Returned from CT.

## 2020-02-11 NOTE — H&P (Signed)
History and Physical  BLAKELEY SCHEIER DVV:[id number] DOB: 08-01-1950 DOA: 02/11/2020   PCP: Christain Sacramento, MD   Patient coming from: Home  Chief Complaint: right side weak, gait instability  HPI:  Judith Hill is a 70 y.o. female with medical history of lacunar hemorrhagic stroke, COPD, anxiety, depression, hypothyroidism, hypertension presenting with 4-day history of generalized weakness, but right greater than left side.  Patient states that her symptoms began about 4 days prior to this admission after she woke up in the morning.  While walking to the bathroom, the patient felt that her gait was unstable.  She described it as that she was trying to walk for, but was actually walking backwards.  This resulted in a sustained fall but she did not hit her head.  She denies any syncope.  She had associated symptoms including some swelling sensation in her right face and feeling like she had difficulty with her right eye movements and blinking.  In addition, the patient described some word finding difficulties.  She denies any loss of her vision.  Her symptoms overall have been fairly constant over the last 4 days.  As result, her family urged her to come to the emergency department for further evaluation.  The patient did complain of a headache but denied any fevers, chills, coughing, hemoptysis, nausea, vomiting, diarrhea, abdominal pain, dysuria, hematuria. She continues to vape, but she quit smoking about 1 year ago after a 45-pack-year history. In the emergency department, the patient was afebrile and hemodynamically stable with oxygen saturation 98% room air.  BMP was unremarkable except for potassium 3.2.  Serum creatinine was 1.04.  CBC was unremarkable with WBC 10.1, hemoglobin 14.9, platelets 220,000.  CT of the brain was negative for any hemorrhage or acute findings.  CTA head and neck showed patent vertebral and basilar arteries.  The carotids were patent.  There is upper chest  biapical scarring.  Assessment/Plan: Right Hemiparesis/Dysphasia -Neurology Consult -PT/OT evaluation -Speech therapy eval -CT brain--neg -MRI brain-- -MRA brain-- -CTA H&N--no LVO;  Carotids, vertebral and basilar arteries patent -Echo-- -LDL-- -HbA1C-- -Antiplatelet--ASA 325 mg daily  Essential hypertension -Holding HCTZ to allow for permissive hypertension  Anxiety/depression -Continue home dose of triazolam and Wellbutrin  Hypothyroidism -Continue Synthroid -Check TSH  Generalized weakness -G26 -Folic acid -TSH -UA neg for pyuria  COPD -Stable on room air -Continue home bronchodilators           Past Medical History:  Diagnosis Date  . Adenomatous polyp of colon 2007  . Anxiety   . Arthritis   . Blood transfusion without reported diagnosis 2009   after spinal fusion- pt states had issues with transfusion- ended up in ICU post transfusion x 9 days   . Cataract    removed both eyes with lens implants   . Constipation    dulcolax prn OTC-   . COPD (chronic obstructive pulmonary disease) ([place])   . CVA (cerebral infarction)    small stroke confirmed on MRI  . Depression   . Hypothyroidism   . Stroke [facility])    Past Surgical History:  Procedure Laterality Date  . CATARACT EXTRACTION W/PHACO Right 10/07/2017   Procedure: CATARACT EXTRACTION PHACO AND INTRAOCULAR LENS PLACEMENT (IOC);  Surgeon: Baruch Goldmann, MD;  Location: [facility];  Service: Ophthalmology;  Laterality: Right;  CDE: 4.21  . CATARACT EXTRACTION W/PHACO Left 10/29/2017   Procedure: CATARACT EXTRACTION PHACO AND INTRAOCULAR LENS PLACEMENT LEFT EYE CDE=4.77;  Surgeon: Baruch Goldmann, MD;  Location: [facility];  Service: Ophthalmology;  Laterality: Left;  left  . COLONOSCOPY    . fracture arm  1965   compund fracture right arm; 7 surgeries with bone graft and skin graft  . POLYPECTOMY    . SPINAL FUSION  2009  . TONSILLECTOMY     Social History:  reports that she has been smoking  cigarettes. She has a 22.50 pack-year smoking history. She has never used smokeless tobacco. She reports current alcohol use. She reports that she does not use drugs.   Family History  Problem Relation Age of Onset  . Colon cancer Paternal Grandmother 33  . Esophageal cancer Maternal Grandfather   . Stomach cancer Neg Hx   . Rectal cancer Neg Hx   . Pancreatic cancer Neg Hx   . Colon polyps Neg Hx      Allergies  Allergen Reactions  . Penicillins Rash and Other (See Comments)    Has patient had a PCN reaction causing immediate rash, facial/tongue/throat swelling, SOB or lightheadedness with hypotension: No Has patient had a PCN reaction causing severe rash involving mucus membranes or skin necrosis: No Has patient had a PCN reaction that required hospitalization: No Has patient had a PCN reaction occurring within the last 10 years: No If all of the above answers are "NO", then may proceed with Cephalosporin use.      Prior to Admission medications   Medication Sig Start Date End Date Taking? Authorizing Provider  ALPRAZolam Duanne Moron) 1 MG tablet Take 0.5-1 mg by mouth 3 (three) times daily as needed for anxiety.  11/06/11   [provider]  buPROPion (WELLBUTRIN XL) 300 MG 24 hr tablet Take 300 mg by mouth daily.    [provider]  Coenzyme Q10 (COQ10) 100 MG CAPS Take 100 mg by mouth daily.    [provider]  Fluticasone-Salmeterol (ADVAIR) 250-50 MCG/DOSE AEPB Inhale into the lungs. 03/28/18   [provider]  hydrochlorothiazide (MICROZIDE) 12.5 MG capsule Take 12.5 mg by mouth daily.    [provider]  ibuprofen (ADVIL,MOTRIN) 200 MG tablet Take 400 mg by mouth daily as needed for headache or moderate pain.    [provider]  ipratropium-albuterol (DUONEB) 0.5-2.5 (3) MG/3ML SOLN Take 3 mLs by nebulization every 6 (six) hours as needed (shortness of breath).    [provider]  levothyroxine (SYNTHROID, LEVOTHROID)  25 MCG tablet Take 25 mcg by mouth daily before breakfast.    [provider]  meclizine (ANTIVERT) 25 MG tablet Take 25 mg by mouth 2 (two) times daily as needed for dizziness.  10/10/12   Fredia Sorrow, MD  Multiple Vitamins-Minerals (HEALTHY EYES/LUTEIN) TABS Take 1 tablet by mouth daily.    [provider]  Multiple Vitamins-Minerals (MULTIVITAMINS THER. W/MINERALS) TABS tablet Take 1 tablet by mouth daily.    [provider]  vitamin C (ASCORBIC ACID) 500 MG tablet Take 500 mg by mouth daily.    [provider]  vitamin E 200 UNIT capsule Take 330 Units by mouth daily.     [provider]    Review of Systems:  Constitutional:  No weight loss, night sweats Head&Eyes: No headache.  No vision loss.  No eye pain or scotoma ENT:  No Difficulty swallowing,Tooth/dental problems,Sore throat,  No ear ache, post nasal drip,  Cardio-vascular:  No chest pain, Orthopnea, PND, swelling in lower extremities,  dizziness, palpitations  GI:  No  abdominal pain, nausea, vomiting, diarrhea, loss of appetite, hematochezia, melena,  heartburn, indigestion, Resp:  No shortness of breath with exertion or at rest. No cough. No coughing up of blood .No wheezing.No chest wall deformity  Skin:  no rash or lesions.  GU:  no dysuria, change in color of urine, no urgency or frequency. No flank pain.  Musculoskeletal:  No joint pain or swelling. No decreased range of motion. No back pain.  Psych:  No change in mood or affect. No depression or anxiety. Neurologic: no vision loss. No syncope  Physical Exam: Vitals:   02/11/20 1030 02/11/20 1100 02/11/20 1115 02/11/20 1130  BP: 131/76 (!) 110/55  (!) 109/98  Pulse: 77 77 78 80  Resp: 18 17 17 15   Temp:      TempSrc:      SpO2: 99% 96% 98% 99%  Weight:      Height:       General:  A&O x 3, NAD, nontoxic, pleasant/cooperative Head/Eye: No conjunctival hemorrhage, no icterus, [place]/AT, No nystagmus ENT:  No  icterus,  No thrush, good dentition, no pharyngeal exudate Neck:  No masses, no lymphadenpathy, no bruits CV:  RRR, no rub, no gallop, no S3 Lung:  CTAB, good air movement, no wheeze, no rhonchi Abdomen: soft/NT, +BS, nondistended, no peritoneal signs Ext: No cyanosis, No rashes, No petechiae, No lymphangitis, No edema Neuro: CNII-XII intact, strength 4/5 in bilateral upper and lower extremities, no dysmetria  Labs on Admission:  Basic Metabolic Panel: Recent Labs  Lab 02/11/20 0936  NA 136  K 3.2*  CL 99  CO2 27  GLUCOSE 90  BUN 23  CREATININE 1.04*  CALCIUM 9.5   Liver Function Tests: Recent Labs  Lab 02/11/20 0936  AST 31  ALT 59*  ALKPHOS 85  BILITOT 0.6  PROT 7.5  ALBUMIN 4.3   No results for input(s): LIPASE, AMYLASE in the last 168 hours. No results for input(s): AMMONIA in the last 168 hours. CBC: Recent Labs  Lab 02/11/20 0936  WBC 10.1  NEUTROABS 6.8  HGB 14.9  HCT 46.0  MCV 96.8  PLT 228   Coagulation Profile: Recent Labs  Lab 02/11/20 0936  INR 1.0   Cardiac Enzymes: No results for input(s): CKTOTAL, CKMB, CKMBINDEX, TROPONINI in the last 168 hours. BNP: Invalid input(s): POCBNP CBG: Recent Labs  Lab 02/11/20 0932  GLUCAP 82   Urine analysis:    Component Value Date/Time   COLORURINE YELLOW 02/11/2020 1029   APPEARANCEUR CLEAR 02/11/2020 1029   LABSPEC 1.017 02/11/2020 1029   PHURINE 5.0 02/11/2020 1029   GLUCOSEU NEGATIVE 02/11/2020 1029   HGBUR NEGATIVE 02/11/2020 1029   [place] 02/11/2020 [place] 02/11/2020 1029   PROTEINUR NEGATIVE 02/11/2020 1029   UROBILINOGEN 0.2 05/27/2008 1105   NITRITE NEGATIVE 02/11/2020 1029   LEUKOCYTESUR NEGATIVE 02/11/2020 1029   Sepsis Labs: @LABRCNTIP (procalcitonin:4,lacticidven:4) ) Recent Results (from the past 240 hour(s))  SARS Coronavirus 2 by RT PCR (hospital order, performed in [place] hospital lab) Nasopharyngeal Nasopharyngeal Swab     Status:  None   Collection Time: 02/11/20  9:59 AM   Specimen: Nasopharyngeal Swab  Result Value Ref Range Status   SARS Coronavirus 2 NEGATIVE NEGATIVE Final    Comment: (NOTE) SARS-CoV-2 target nucleic acids are NOT DETECTED.  The SARS-CoV-2 RNA is generally detectable in upper and lower respiratory specimens during the acute phase of infection. The lowest concentration of SARS-CoV-2 viral copies this assay can detect is 250 copies / mL. A negative result does not preclude SARS-CoV-2 infection and  should not be used as the sole basis for treatment or other patient management decisions.  A negative result may occur with improper specimen collection / handling, submission of specimen other than nasopharyngeal swab, presence of viral mutation(s) within the areas targeted by this assay, and inadequate number of viral copies (<250 copies / mL). A negative result must be combined with clinical observations, patient history, and epidemiological information.  Fact Sheet for Patients:   StrictlyIdeas.no  Fact Sheet for Healthcare Providers: BankingDealers.co.za  This test is not yet approved or  cleared by the [place] FDA and has been authorized for detection and/or diagnosis of SARS-CoV-2 by FDA under an Emergency Use Authorization (EUA).  This EUA will remain in effect (meaning this test can be used) for the duration of the COVID-19 declaration under Section 564(b)(1) of the Act, 21 U.S.C. section 360bbb-3(b)(1), unless the authorization is terminated or revoked sooner.  Performed at [facility], [address], [place], [place] [address]      Radiological Exams on Admission: CT Angio Head W or Wo Contrast  Result Date: 02/11/2020 CLINICAL DATA:  Difficulty speaking with right-sided weakness for 4 days EXAM: CT ANGIOGRAPHY HEAD AND NECK TECHNIQUE: Multidetector CT imaging of the head and neck was performed using the standard protocol  during bolus administration of intravenous contrast. Multiplanar CT image reconstructions and MIPs were obtained to evaluate the vascular anatomy. Carotid stenosis measurements (when applicable) are obtained utilizing NASCET criteria, using the distal internal carotid diameter as the denominator. CONTRAST:  3mL OMNIPAQUE IOHEXOL 350 MG/ML SOLN COMPARISON:  Head CT from earlier today. FINDINGS: CTA NECK FINDINGS Aortic arch: Essentially not covered. Right carotid system: Mild low-density plaque at the bifurcation without flow reducing stenosis or ulceration. Negative for dissection. Left carotid system: Mild low-density plaque at the bifurcation. No stenosis or ulceration. Vertebral arteries: No proximal subclavian stenosis (the ostia are not covered). Left dominant vertebral artery. Both vertebral arteries are smooth and widely patent to the dura. 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 No acute or focal finding. Other neck: Enlarged right thyroid from nodule/s. A right-sided nodule may measure up to 3.2 cm. Smaller left thyroid nodule posteriorly and measuring 14 mm. Upper chest: Biapical interstitial reticulation/fibrotic appearance. Review of the MIP images confirms the above findings CTA HEAD FINDINGS Anterior circulation: The left ICA is smaller than the right in the setting of fetal type right PCA. No branch occlusion, beading, aneurysm, or flow reducing stenosis. Mild calcified plaque is seen along the carotid siphons. Posterior circulation: Left dominant vertebral artery. The vertebral and basilar arteries are smooth and widely patent. Fetal type right PCA. Venous sinuses: Diffusely patent. Anatomic variants: As above Review of the MIP images confirms the above findings IMPRESSION: 1. No emergent finding. 2. Mild atherosclerosis without flow limiting stenosis in the head and neck. 3. Nodular thyroid. Recommend thyroid US.(Ref: J Am Coll Radiol. 2015 Feb;12(2): 143-50). Electronically  Signed   By: Monte Fantasia M.D.   On: 02/11/2020 10:58   CT HEAD WO CONTRAST  Result Date: 02/11/2020 CLINICAL DATA:  Difficulty speaking and right-sided weakness for 4 days EXAM: CT HEAD WITHOUT CONTRAST TECHNIQUE: Contiguous axial images were obtained from the base of the skull through the vertex without intravenous contrast. COMPARISON:  Brain MRI 10/10/2012 FINDINGS: Brain: No evidence of acute infarction, hemorrhage, hydrocephalus, extra-axial collection or mass lesion/mass effect. Vascular: No hyperdense vessel or unexpected calcification. High-density appearance at the right carotid terminus on axial slices is from volume averaging by reformats.  Skull: Normal. Negative for fracture or focal lesion. Sinuses/Orbits: No acute finding.  Bilateral cataract resection IMPRESSION: No hemorrhage or visible infarct. Electronically Signed   By: Monte Fantasia M.D.   On: 02/11/2020 10:08   CT Angio Neck W and/or Wo Contrast  Result Date: 02/11/2020 CLINICAL DATA:  Difficulty speaking with right-sided weakness for 4 days EXAM: CT ANGIOGRAPHY HEAD AND NECK TECHNIQUE: Multidetector CT imaging of the head and neck was performed using the standard protocol during bolus administration of intravenous contrast. Multiplanar CT image reconstructions and MIPs were obtained to evaluate the vascular anatomy. Carotid stenosis measurements (when applicable) are obtained utilizing NASCET criteria, using the distal internal carotid diameter as the denominator. CONTRAST:  11mL OMNIPAQUE IOHEXOL 350 MG/ML SOLN COMPARISON:  Head CT from earlier today. FINDINGS: CTA NECK FINDINGS Aortic arch: Essentially not covered. Right carotid system: Mild low-density plaque at the bifurcation without flow reducing stenosis or ulceration. Negative for dissection. Left carotid system: Mild low-density plaque at the bifurcation. No stenosis or ulceration. Vertebral arteries: No proximal subclavian stenosis (the ostia are not covered). Left  dominant vertebral artery. Both vertebral arteries are smooth and widely patent to the dura. 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 No acute or focal finding. Other neck: Enlarged right thyroid from nodule/s. A right-sided nodule may measure up to 3.2 cm. Smaller left thyroid nodule posteriorly and measuring 14 mm. Upper chest: Biapical interstitial reticulation/fibrotic appearance. Review of the MIP images confirms the above findings CTA HEAD FINDINGS Anterior circulation: The left ICA is smaller than the right in the setting of fetal type right PCA. No branch occlusion, beading, aneurysm, or flow reducing stenosis. Mild calcified plaque is seen along the carotid siphons. Posterior circulation: Left dominant vertebral artery. The vertebral and basilar arteries are smooth and widely patent. Fetal type right PCA. Venous sinuses: Diffusely patent. Anatomic variants: As above Review of the MIP images confirms the above findings IMPRESSION: 1. No emergent finding. 2. Mild atherosclerosis without flow limiting stenosis in the head and neck. 3. Nodular thyroid. Recommend thyroid US.(Ref: J Am Coll Radiol. 2015 Feb;12(2): 143-50). Electronically Signed   By: Monte Fantasia M.D.   On: 02/11/2020 10:58    EKG: Independently reviewed. Sinus RBBB    Time spent:60 minutes Code Status:   FULL Family Communication:  No Family at bedside Disposition Plan: expect 1 day hospitalization Consults called: neuro DVT Prophylaxis: [place] Lovenox  Orson Eva, DO  [facility] Pager [phone]  If 7PM-7AM, please contact night-coverage [email or web address] Password [email or web address] 02/11/2020, 12:01 PM

## 2020-02-11 NOTE — ED Notes (Signed)
Going to X-ray

## 2020-02-11 NOTE — Progress Notes (Signed)
  Echocardiogram 2D Echocardiogram has been performed.  Michiel Cowboy 02/11/2020, 2:48 PM

## 2020-02-11 NOTE — ED Notes (Signed)
Gone to CT

## 2020-02-11 NOTE — ED Provider Notes (Signed)
[facility] EMERGENCY DEPARTMENT Provider Note   CSN: [id number] Arrival date & time: 02/11/20  8182     History Chief Complaint  Patient presents with  . Weakness    BOBBY RAGAN is a 70 y.o. female.  She is complaining of 4 days feeling numbness on the right side of her face, generally weak all over although right greater than left, feeling imbalance when walking.  She has had 1 fall because of this.  She feels her right eye is not blinking the same as her left eye.  She is having difficulty getting her words out.  She had problems with her right ear infection few weeks ago and went through a course or 2 of antibiotics and steroids.  She feels like that is improving.  She does have a headache.  No cough fevers chills abdominal pain vomiting or diarrhea.  She endorses constipation.  No urine symptoms.  The history is provided by the patient.  Cerebrovascular Accident This is a new problem. The current episode started more than 2 days ago. The problem occurs constantly. The problem has not changed since onset.Associated symptoms include headaches. Pertinent negatives include no chest pain, no abdominal pain and no shortness of breath. Nothing aggravates the symptoms. Nothing relieves the symptoms. She has tried nothing for the symptoms. The treatment provided no relief.       Past Medical History:  Diagnosis Date  . Adenomatous polyp of colon 2007  . Anxiety   . Arthritis   . Blood transfusion without reported diagnosis 2009   after spinal fusion- pt states had issues with transfusion- ended up in ICU post transfusion x 9 days   . Cataract    removed both eyes with lens implants   . Constipation    dulcolax prn OTC-   . COPD (chronic obstructive pulmonary disease) ([place])   . CVA (cerebral infarction)    small stroke confirmed on MRI  . Depression   . Hypothyroidism   . Stroke [facility])     Patient Active Problem List   Diagnosis Date Noted  . Adjustment reaction with  anxiety and depression 05/05/2017  . Encounter for Medicare annual wellness exam 05/05/2017  . Caregiver burden 10/28/2016  . Dermatitis of left foot 10/28/2016  . Reduced libido 10/28/2016  . TSH elevation 04/29/2016  . Chronic midline low back pain without sciatica 10/30/2015  . Essential hypertension 04/18/2015  . Dizziness 04/28/2013  . CVA (cerebral vascular accident) ([place]) 04/28/2013  . Obesity (BMI 30-39.9) 04/28/2013  . ANXIETY DISORDER 06/05/2008  . PTSD 06/05/2008  . DEPRESSION 06/05/2008  . ASTHMA 06/05/2008  . COPD 06/05/2008  . DEGENERATIVE JOINT DISEASE 06/05/2008  . POSTMENOPAUSAL STATUS 06/05/2008  . Asthma 06/05/2008    Past Surgical History:  Procedure Laterality Date  . CATARACT EXTRACTION W/PHACO Right 10/07/2017   Procedure: CATARACT EXTRACTION PHACO AND INTRAOCULAR LENS PLACEMENT (IOC);  Surgeon: Baruch Goldmann, MD;  Location: [facility] ORS;  Service: Ophthalmology;  Laterality: Right;  CDE: 4.21  . CATARACT EXTRACTION W/PHACO Left 10/29/2017   Procedure: CATARACT EXTRACTION PHACO AND INTRAOCULAR LENS PLACEMENT LEFT EYE CDE=4.77;  Surgeon: Baruch Goldmann, MD;  Location: [facility] ORS;  Service: Ophthalmology;  Laterality: Left;  left  . COLONOSCOPY    . fracture arm  1965   compund fracture right arm; 7 surgeries with bone graft and skin graft  . POLYPECTOMY    . SPINAL FUSION  2009  . TONSILLECTOMY       OB History   No  obstetric history on file.     Family History  Problem Relation Age of Onset  . Colon cancer Paternal Grandmother 54  . Esophageal cancer Maternal Grandfather   . Stomach cancer Neg Hx   . Rectal cancer Neg Hx   . Pancreatic cancer Neg Hx   . Colon polyps Neg Hx     Social History   Tobacco Use  . Smoking status: Current Every Day Smoker    Packs/day: 0.50    Years: 45.00    Pack years: 22.50    Types: Cigarettes  . Smokeless tobacco: Never Used  Vaping Use  . Vaping Use: Never used  Substance Use Topics  . Alcohol use: Yes     Comment: occasionally  . Drug use: No    Home Medications Prior to Admission medications   Medication Sig Start Date End Date Taking? Authorizing Provider  ALPRAZolam Duanne Moron) 1 MG tablet Take 0.5-1 mg by mouth 3 (three) times daily as needed for anxiety.  11/06/11   [provider]  buPROPion (WELLBUTRIN XL) 300 MG 24 hr tablet Take 300 mg by mouth daily.    [provider]  Coenzyme Q10 (COQ10) 100 MG CAPS Take 100 mg by mouth daily.    [provider]  Fluticasone-Salmeterol (ADVAIR) 250-50 MCG/DOSE AEPB Inhale into the lungs. 03/28/18   [provider]  hydrochlorothiazide (MICROZIDE) 12.5 MG capsule Take 12.5 mg by mouth daily.    [provider]  ibuprofen (ADVIL,MOTRIN) 200 MG tablet Take 400 mg by mouth daily as needed for headache or moderate pain.    [provider]  ipratropium-albuterol (DUONEB) 0.5-2.5 (3) MG/3ML SOLN Take 3 mLs by nebulization every 6 (six) hours as needed (shortness of breath).    [provider]  levothyroxine (SYNTHROID, LEVOTHROID) 25 MCG tablet Take 25 mcg by mouth daily before breakfast.    [provider]  meclizine (ANTIVERT) 25 MG tablet Take 25 mg by mouth 2 (two) times daily as needed for dizziness.  10/10/12   Fredia Sorrow, MD  Multiple Vitamins-Minerals (HEALTHY EYES/LUTEIN) TABS Take 1 tablet by mouth daily.    [provider]  Multiple Vitamins-Minerals (MULTIVITAMINS THER. W/MINERALS) TABS tablet Take 1 tablet by mouth daily.    [provider]  vitamin C (ASCORBIC ACID) 500 MG tablet Take 500 mg by mouth daily.    [provider]  vitamin E 200 UNIT capsule Take 330 Units by mouth daily.     [provider]    Allergies    Penicillins  Review of Systems   Review of Systems  Constitutional: Negative for fever.  HENT: Negative for sore throat.   Eyes: Negative for visual disturbance.  Respiratory: Negative for shortness of breath.    Cardiovascular: Negative for chest pain.  Gastrointestinal: Positive for constipation. Negative for abdominal pain.  Genitourinary: Negative for dysuria.  Musculoskeletal: Positive for gait problem.  Skin: Negative for rash.  Neurological: Positive for speech difficulty, weakness, numbness and headaches. Negative for facial asymmetry.    Physical Exam Updated Vital Signs BP 108/71   Pulse 81   Temp 98 F (36.7 C) (Oral)   Resp 19   Ht 5\' 6"  (1.676 m)   Wt 103.4 kg   SpO2 93%   BMI 36.80 kg/m   Physical Exam Vitals and nursing note reviewed.  Constitutional:      General: She is not in acute distress.    Appearance: Normal appearance. She is well-developed and well-nourished.  HENT:  Head: Normocephalic and atraumatic.     Right Ear: Tympanic membrane, ear canal and external ear normal.     Left Ear: Tympanic membrane, ear canal and external ear normal.     Nose: Nose normal.     Mouth/Throat:     Mouth: Mucous membranes are moist.     Pharynx: Oropharynx is clear.  Eyes:     Conjunctiva/sclera: Conjunctivae normal.  Cardiovascular:     Rate and Rhythm: Normal rate and regular rhythm.     Heart sounds: No murmur heard.   Pulmonary:     Effort: Pulmonary effort is normal. No respiratory distress.     Breath sounds: Normal breath sounds.  Abdominal:     Palpations: Abdomen is soft.     Tenderness: There is no abdominal tenderness. There is no guarding or rebound.  Musculoskeletal:        General: No deformity, signs of injury or edema. Normal range of motion.     Cervical back: Neck supple.  Skin:    General: Skin is warm and dry.     Capillary Refill: Capillary refill takes less than 2 seconds.  Neurological:     Mental Status: She is alert.     Sensory: Sensory deficit present.     Gait: Gait abnormal.     Comments: She is awake and alert.  No obvious cranial nerve difficulties although does have subjective decrease sensation forehead and cheek.  No upper  extremity or lower extremity weakness although patient states right side takes more effort.  She has some slight difficulty with finger-to-nose on the right side.  Her gait was somewhat slow and deliberate.  Psychiatric:        Mood and Affect: Mood and affect normal.     ED Results / Procedures / Treatments   Labs (all labs ordered are listed, but only abnormal results are displayed) Labs Reviewed  COMPREHENSIVE METABOLIC PANEL - Abnormal; Notable for the following components:      Result Value   Potassium 3.2 (*)    Creatinine, Ser 1.04 (*)    ALT 59 (*)    GFR, Estimated 59 (*)    All other components within normal limits  RAPID URINE DRUG SCREEN, HOSP PERFORMED - Abnormal; Notable for the following components:   Benzodiazepines POSITIVE (*)    All other components within normal limits  SARS CORONAVIRUS 2 BY RT PCR (HOSPITAL ORDER, [place] [facility])  ETHANOL  PROTIME-INR  APTT  CBC  DIFFERENTIAL  URINALYSIS, ROUTINE W REFLEX MICROSCOPIC  VITAMIN B12  FOLATE  TSH  HIV ANTIBODY (ROUTINE TESTING W REFLEX)  HEMOGLOBIN A1C  LIPID PANEL  CBG MONITORING, ED    EKG EKG Interpretation  Date/Time:  Sunday February 11 2020 09:34:17 EST Ventricular Rate:  83 PR Interval:    QRS Duration: 139 QT Interval:  390 QTC Calculation: 459 R Axis:   22 Text Interpretation: Sinus rhythm Right bundle branch block new RBBB since prior 9/19 Confirmed by Aletta Edouard [phone]) on 02/11/2020 9:57:31 AM   Radiology CT Angio Head W or Wo Contrast  Result Date: 02/11/2020 CLINICAL DATA:  Difficulty speaking with right-sided weakness for 4 days EXAM: CT ANGIOGRAPHY HEAD AND NECK TECHNIQUE: Multidetector CT imaging of the head and neck was performed using the standard protocol during bolus administration of intravenous contrast. Multiplanar CT image reconstructions and MIPs were obtained to evaluate the vascular anatomy. Carotid stenosis measurements (when applicable) are  obtained utilizing NASCET criteria, using the  distal internal carotid diameter as the denominator. CONTRAST:  47mL OMNIPAQUE IOHEXOL 350 MG/ML SOLN COMPARISON:  Head CT from earlier today. FINDINGS: CTA NECK FINDINGS Aortic arch: Essentially not covered. Right carotid system: Mild low-density plaque at the bifurcation without flow reducing stenosis or ulceration. Negative for dissection. Left carotid system: Mild low-density plaque at the bifurcation. No stenosis or ulceration. Vertebral arteries: No proximal subclavian stenosis (the ostia are not covered). Left dominant vertebral artery. Both vertebral arteries are smooth and widely patent to the dura. 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 No acute or focal finding. Other neck: Enlarged right thyroid from nodule/s. A right-sided nodule may measure up to 3.2 cm. Smaller left thyroid nodule posteriorly and measuring 14 mm. Upper chest: Biapical interstitial reticulation/fibrotic appearance. Review of the MIP images confirms the above findings CTA HEAD FINDINGS Anterior circulation: The left ICA is smaller than the right in the setting of fetal type right PCA. No branch occlusion, beading, aneurysm, or flow reducing stenosis. Mild calcified plaque is seen along the carotid siphons. Posterior circulation: Left dominant vertebral artery. The vertebral and basilar arteries are smooth and widely patent. Fetal type right PCA. Venous sinuses: Diffusely patent. Anatomic variants: As above Review of the MIP images confirms the above findings IMPRESSION: 1. No emergent finding. 2. Mild atherosclerosis without flow limiting stenosis in the head and neck. 3. Nodular thyroid. Recommend thyroid US.(Ref: J Am Coll Radiol. 2015 Feb;12(2): 143-50). Electronically Signed   By: Monte Fantasia M.D.   On: 02/11/2020 10:58   DG Chest 2 View  Result Date: 02/11/2020 CLINICAL DATA:  Right-sided weakness. Facial weakness. Loss of balance for the past 4 days.  EXAM: CHEST - 2 VIEW COMPARISON:  04/15/2018 FINDINGS: Normal sized heart. Clear lungs. The lungs remain hyperexpanded with diffuse prominence of the interstitial markings. No pleural fluid or Kerley lines. Thoracolumbar spine degenerative changes. IMPRESSION: No acute abnormality. COPD. Electronically Signed   By: Claudie Revering M.D.   On: 02/11/2020 13:38   CT HEAD WO CONTRAST  Result Date: 02/11/2020 CLINICAL DATA:  Difficulty speaking and right-sided weakness for 4 days EXAM: CT HEAD WITHOUT CONTRAST TECHNIQUE: Contiguous axial images were obtained from the base of the skull through the vertex without intravenous contrast. COMPARISON:  Brain MRI 10/10/2012 FINDINGS: Brain: No evidence of acute infarction, hemorrhage, hydrocephalus, extra-axial collection or mass lesion/mass effect. Vascular: No hyperdense vessel or unexpected calcification. High-density appearance at the right carotid terminus on axial slices is from volume averaging by reformats. Skull: Normal. Negative for fracture or focal lesion. Sinuses/Orbits: No acute finding.  Bilateral cataract resection IMPRESSION: No hemorrhage or visible infarct. Electronically Signed   By: Monte Fantasia M.D.   On: 02/11/2020 10:08   CT Angio Neck W and/or Wo Contrast  Result Date: 02/11/2020 CLINICAL DATA:  Difficulty speaking with right-sided weakness for 4 days EXAM: CT ANGIOGRAPHY HEAD AND NECK TECHNIQUE: Multidetector CT imaging of the head and neck was performed using the standard protocol during bolus administration of intravenous contrast. Multiplanar CT image reconstructions and MIPs were obtained to evaluate the vascular anatomy. Carotid stenosis measurements (when applicable) are obtained utilizing NASCET criteria, using the distal internal carotid diameter as the denominator. CONTRAST:  10mL OMNIPAQUE IOHEXOL 350 MG/ML SOLN COMPARISON:  Head CT from earlier today. FINDINGS: CTA NECK FINDINGS Aortic arch: Essentially not covered. Right carotid  system: Mild low-density plaque at the bifurcation without flow reducing stenosis or ulceration. Negative for dissection. Left carotid system: Mild low-density plaque at the bifurcation. No  stenosis or ulceration. Vertebral arteries: No proximal subclavian stenosis (the ostia are not covered). Left dominant vertebral artery. Both vertebral arteries are smooth and widely patent to the dura. 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 No acute or focal finding. Other neck: Enlarged right thyroid from nodule/s. A right-sided nodule may measure up to 3.2 cm. Smaller left thyroid nodule posteriorly and measuring 14 mm. Upper chest: Biapical interstitial reticulation/fibrotic appearance. Review of the MIP images confirms the above findings CTA HEAD FINDINGS Anterior circulation: The left ICA is smaller than the right in the setting of fetal type right PCA. No branch occlusion, beading, aneurysm, or flow reducing stenosis. Mild calcified plaque is seen along the carotid siphons. Posterior circulation: Left dominant vertebral artery. The vertebral and basilar arteries are smooth and widely patent. Fetal type right PCA. Venous sinuses: Diffusely patent. Anatomic variants: As above Review of the MIP images confirms the above findings IMPRESSION: 1. No emergent finding. 2. Mild atherosclerosis without flow limiting stenosis in the head and neck. 3. Nodular thyroid. Recommend thyroid US.(Ref: J Am Coll Radiol. 2015 Feb;12(2): 143-50). Electronically Signed   By: Monte Fantasia M.D.   On: 02/11/2020 10:58   ECHOCARDIOGRAM COMPLETE  Result Date: 02/11/2020    ECHOCARDIOGRAM REPORT   Patient Name:   CHARNIQUA VANMARTER Date of Exam: 02/11/2020 Medical Rec #:  [id number]        Height:       66.0 in Accession #:    [id number]       Weight:       228.0 lb Date of Birth:  05-Mar-1950        BSA:          2.114 m Patient Age:    38 years         BP:           109/98 mmHg Patient Gender: F                HR:            82 bpm. Exam Location:  [facility] Procedure: 2D Echo, Cardiac Doppler and Color Doppler Indications:    Stroke 434.91 / I163.9  History:        Patient has prior history of Echocardiogram examinations, most                 recent 05/29/2013. COPD and Stroke; Risk Factors:Current Smoker.  Sonographer:    Vickie Epley RDCS Referring Phys: [phone] DAVID TAT IMPRESSIONS  1. Left ventricular ejection fraction, by estimation, is 60 to 65%. The left ventricle has normal function. The left ventricle has no regional wall motion abnormalities. Left ventricular diastolic parameters are consistent with Grade I diastolic dysfunction (impaired relaxation).  2. Right ventricular systolic function is normal. The right ventricular size is normal.  3. The mitral valve is normal in structure. No evidence of mitral valve regurgitation. No evidence of mitral stenosis.  4. The aortic valve is normal in structure. Aortic valve regurgitation is not visualized. No aortic stenosis is present.  5. The inferior vena cava is normal in size with greater than 50% respiratory variability, suggesting right atrial pressure of 3 mmHg. FINDINGS  Left Ventricle: Left ventricular ejection fraction, by estimation, is 60 to 65%. The left ventricle has normal function. The left ventricle has no regional wall motion abnormalities. The left ventricular internal cavity size was normal in size. There is  no left ventricular hypertrophy. Left ventricular diastolic parameters are consistent with  Grade I diastolic dysfunction (impaired relaxation). Normal left ventricular filling pressure. Right Ventricle: The right ventricular size is normal. No increase in right ventricular wall thickness. Right ventricular systolic function is normal. Left Atrium: Left atrial size was normal in size. Right Atrium: Right atrial size was normal in size. Pericardium: There is no evidence of pericardial effusion. Mitral Valve: The mitral valve is normal in structure. No evidence of  mitral valve regurgitation. No evidence of mitral valve stenosis. Tricuspid Valve: The tricuspid valve is normal in structure. Tricuspid valve regurgitation is not demonstrated. No evidence of tricuspid stenosis. Aortic Valve: The aortic valve is normal in structure. Aortic valve regurgitation is not visualized. No aortic stenosis is present. Pulmonic Valve: The pulmonic valve was normal in structure. Pulmonic valve regurgitation is not visualized. No evidence of pulmonic stenosis. Aorta: The aortic root is normal in size and structure. Venous: The inferior vena cava is normal in size with greater than 50% respiratory variability, suggesting right atrial pressure of 3 mmHg. IAS/Shunts: No atrial level shunt detected by color flow Doppler.  LEFT VENTRICLE PLAX 2D LVIDd:         4.91 cm     Diastology LVIDs:         3.69 cm     LV e' medial:    4.54 cm/s LV PW:         0.77 cm     LV E/e' medial:  9.8 LV IVS:        0.78 cm     LV e' lateral:   7.14 cm/s LVOT diam:     2.00 cm     LV E/e' lateral: 6.2 LV SV:         50 LV SV Index:   24 LVOT Area:     3.14 cm  LV Volumes (MOD) LV vol d, MOD A2C: 65.0 ml LV vol d, MOD A4C: 61.0 ml LV vol s, MOD A2C: 30.7 ml LV vol s, MOD A4C: 26.9 ml LV SV MOD A2C:     34.3 ml LV SV MOD A4C:     61.0 ml LV SV MOD BP:      35.7 ml RIGHT VENTRICLE RV S prime:     11.30 cm/s TAPSE (M-mode): 2.2 cm LEFT ATRIUM             Index       RIGHT ATRIUM           Index LA diam:        3.00 cm 1.42 cm/m  RA Area:     14.10 cm LA Vol (A2C):   23.2 ml 10.97 ml/m RA Volume:   34.80 ml  16.46 ml/m LA Vol (A4C):   20.0 ml 9.46 ml/m LA Biplane Vol: 21.8 ml 10.31 ml/m  AORTIC VALVE LVOT Vmax:   84.60 cm/s LVOT Vmean:  50.900 cm/s LVOT VTI:    0.159 m  AORTA Ao Root diam: 3.20 cm MITRAL VALVE MV Area (PHT): 2.95 cm    SHUNTS MV Decel Time: 257 msec    Systemic VTI:  0.16 m MV E velocity: 44.60 cm/s  Systemic Diam: 2.00 cm MV A velocity: 52.70 cm/s MV E/A ratio:  0.85 Mihai Croitoru MD  Electronically signed by Sanda Klein MD Signature Date/Time: 02/11/2020/3:37:40 PM    Final     Procedures Procedures   Medications Ordered in ED Medications  aspirin EC tablet 325 mg (325 mg Oral Given 02/11/20 1249)  ALPRAZolam (XANAX) tablet 0.5-1 mg (1  mg Oral Given 02/11/20 1338)  buPROPion (WELLBUTRIN XL) 24 hr tablet 300 mg (300 mg Oral Given 02/11/20 1338)  levothyroxine (SYNTHROID) tablet 25 mcg (has no administration in time range)  meclizine (ANTIVERT) tablet 25 mg (25 mg Oral Given 02/11/20 1338)  multivitamin with minerals tablet 1 tablet (1 tablet Oral Given 02/11/20 1338)  ascorbic acid (VITAMIN C) tablet 500 mg (500 mg Oral Given 02/11/20 1338)  vitamin E capsule 300 Units (has no administration in time range)  mometasone-formoterol (DULERA) 200-5 MCG/ACT inhaler 2 puff (2 puffs Inhalation Not Given 02/11/20 1256)  ipratropium-albuterol (DUONEB) 0.5-2.5 (3) MG/3ML nebulizer solution 3 mL (has no administration in time range)  0.9 %  sodium chloride infusion ( Intravenous New Bag/Given 02/11/20 1349)  acetaminophen (TYLENOL) tablet 650 mg (has no administration in time range)    Or  acetaminophen (TYLENOL) 160 MG/5ML solution 650 mg (has no administration in time range)    Or  acetaminophen (TYLENOL) suppository 650 mg (has no administration in time range)  senna-docusate (Senokot-S) tablet 1 tablet (has no administration in time range)  enoxaparin (LOVENOX) injection 40 mg (has no administration in time range)  iohexol (OMNIPAQUE) 350 MG/ML injection 75 mL (75 mLs Intravenous Contrast Given 02/11/20 1043)   stroke: mapping our early stages of recovery book ( Does not apply Given 02/11/20 1531)    ED Course  I have reviewed the triage vital signs and the nursing notes.  Pertinent labs & imaging results that were available during my care of the patient were reviewed by me and considered in my medical decision making (see chart for details).  Clinical Course as of 02/11/20  1740  Sun Feb 11, 2020  [id number] Discussed with Dr. Rory Percy neurology.  He is recommending that the patient get a CTA head and neck.  If no obvious stroke will need an MRI.  If apparent stroke will need admission to [facility] for further neuro eval. [MB]  [phone] CT ordered and interpreted by me as no acute findings. [MB]  [id number] right bundle branch block [MB]  1035 Patient states she had vertigo before and this does not feel like her vertigo.  Lab work fairly unremarkable other than mildly low potassium at 3.2.  Head CT read as negative.  Per neurology recommendation she is coming back for CT angio head and neck. [MB]  51 Dr. Rory Percy reviewed the patient's imaging.  No large territory stroke.  Due to the fact that this is been going on for days he thinks it would be appropriate for her to be admitted here and get an MRI and a formal neuro consult tomorrow.  Will review with patient. [MB]  [id number] Discussed with Dr. Carles Collet [facility] hospitalist who will evaluate the patient for admission. [MB]    Clinical Course User Index [MB] Hayden Rasmussen, MD   MDM Rules/Calculators/A&P                         This patient complains of weakness right greater than left, difficulty with speech, facial numbness, headache unbalanced gait; this involves an extensive number of treatment Options and is a complaint that carries with it a high risk of complications and Morbidity. The differential includes stroke, bleed, dissection, metabolic derangement, medication side effect  I ordered, reviewed and interpreted labs, which included CBC with normal white count normal hemoglobin, chemistries normal low potassium, coags normal, alcohol negative, Covid test negative, urinalysis normal I ordered imaging studies which included CT  head, CT angio head and neck and I independently    visualized and interpreted imaging which showed no acute findings  Previous records obtained and reviewed in epic, did have a positive MRI for small  infarct in the past I consulted neurology Dr.Arora and [facility] hospitalist Dr. Carles Collet and discussed lab and imaging findings  Critical Interventions: None  After the interventions stated above, I reevaluated the patient and found patient still to be symptomatic. She is agreeable to admission for further work-up.   Final Clinical Impression(s) / ED Diagnoses Final diagnoses:  Nodular thyroid disease  Weakness  Ataxia  Stroke (cerebrum) [facility])    Rx / DC Orders ED Discharge Orders    None       Hayden Rasmussen, MD 02/11/20 1743

## 2020-02-12 ENCOUNTER — Observation Stay (HOSPITAL_COMMUNITY): Payer: Medicare Other

## 2020-02-12 DIAGNOSIS — I10 Essential (primary) hypertension: Secondary | ICD-10-CM | POA: Diagnosis not present

## 2020-02-12 DIAGNOSIS — E669 Obesity, unspecified: Secondary | ICD-10-CM | POA: Diagnosis not present

## 2020-02-12 DIAGNOSIS — G8191 Hemiplegia, unspecified affecting right dominant side: Secondary | ICD-10-CM | POA: Diagnosis not present

## 2020-02-12 LAB — LIPID PANEL
Cholesterol: 205 mg/dL — ABNORMAL HIGH (ref 0–200)
HDL: 57 mg/dL (ref >40)
LDL Cholesterol: 131 mg/dL — ABNORMAL HIGH (ref 0–99)
Total CHOL/HDL Ratio: 3.6 RATIO
Triglycerides: 83 mg/dL (ref <150)
VLDL: 17 mg/dL (ref 0–40)

## 2020-02-12 LAB — HEMOGLOBIN A1C
Hgb A1c MFr Bld: 5.6 % (ref 4.8–5.6)
Mean Plasma Glucose: 114.02 mg/dL

## 2020-02-12 MED ORDER — ATORVASTATIN CALCIUM 10 MG PO TABS: 20.0000 mg | ORAL_TABLET | Freq: Every day | ORAL | Status: DC

## 2020-02-12 MED ORDER — ATORVASTATIN CALCIUM 20 MG PO TABS: 20.0000 mg | ORAL_TABLET | Freq: Every day | ORAL | 1 refills | Status: AC

## 2020-02-12 MED ORDER — ASPIRIN 81 MG PO TBEC: 81.0000 mg | DELAYED_RELEASE_TABLET | Freq: Every day | ORAL | 12 refills | Status: AC

## 2020-02-12 NOTE — Evaluation (Signed)
Physical Therapy Evaluation Patient Details Name: Judith Hill MRN: [id number] DOB: 10/22/1950 Today's Date: 02/12/2020   History of Present Illness  Judith Hill is a 70 y.o. female with medical history of lacunar hemorrhagic stroke, COPD, anxiety, depression, hypothyroidism, hypertension presenting with 4-day history of generalized weakness, but right greater than left side.  Patient states that her symptoms began about 4 days prior to this admission after she woke up in the morning.  While walking to the bathroom, the patient felt that her gait was unstable.  She described it as that she was trying to walk for, but was actually walking backwards.  This resulted in a sustained fall but she did not hit her head.  She denies any syncope.  She had associated symptoms including some swelling sensation in her right face and feeling like she had difficulty with her right eye movements and blinking.  In addition, the patient described some word finding difficulties.  She denies any loss of her vision.  Her symptoms overall have been fairly constant over the last 4 days.  As result, her family urged her to come to the emergency department for further evaluation.  The patient did complain of a headache but denied any fevers, chills, coughing, hemoptysis, nausea, vomiting, diarrhea, abdominal pain, dysuria, hematuria.  She continues to vape, but she quit smoking about 1 year ago after a 45-pack-year history.  In the emergency department, the patient was afebrile and hemodynamically stable with oxygen saturation 98% room air.  BMP was unremarkable except for potassium 3.2.  Serum creatinine was 1.04.  CBC was unremarkable with WBC 10.1, hemoglobin 14.9, platelets 220,000.  CT of the brain was negative for any hemorrhage or acute findings.  CTA head and neck showed patent vertebral and basilar arteries.  The carotids were patent.  There is upper chest biapical scarring.    Clinical Impression  Patient  functioning near baseline for functional mobility and gait demonstrating good return for ambulation in room, hallways and transferring to commode in bathroom without loss of balance.  Plan:  Patient discharged from physical therapy to care of nursing for ambulation daily as tolerated for length of stay.     Follow Up Recommendations No PT follow up    Equipment Recommendations  None recommended by PT    Recommendations for Other Services       Precautions / Restrictions Precautions Precautions: None Restrictions Weight Bearing Restrictions: No      Mobility  Bed Mobility Overal bed mobility: Modified Independent                  Transfers Overall transfer level: Modified independent                  Ambulation/Gait Ambulation/Gait assistance: Modified independent (Device/Increase time) Gait Distance (Feet): 150 Feet Assistive device: None Gait Pattern/deviations: WFL(Within Functional Limits) Gait velocity: slightly decreased   General Gait Details: slightly labored cadence without loss of balance for ambulation in room and hallways  Stairs            Wheelchair Mobility    Modified Rankin (Stroke Patients Only)       Balance Overall balance assessment: No apparent balance deficits (not formally assessed)                                           Pertinent Vitals/Pain Pain Assessment: No/denies  pain    Home Living Family/patient expects to be discharged to:: Private residence Living Arrangements: Alone Available Help at Discharge: Friend(s);Available PRN/intermittently (has call lights in apartment) Type of Home: Apartment Home Access: Level entry     Home Layout: One level Home Equipment: Cane - single point;Grab bars - tub/shower      Prior Function Level of Independence: Independent         Comments: Hydrographic surveyor, drives     Journalist, newspaper        Extremity/Trunk Assessment   Upper Extremity  Assessment Upper Extremity Assessment: Defer to OT evaluation    Lower Extremity Assessment Lower Extremity Assessment: Overall WFL for tasks assessed    Cervical / Trunk Assessment Cervical / Trunk Assessment: Normal  Communication   Communication: No difficulties  Cognition Arousal/Alertness: Awake/alert Behavior During Therapy: WFL for tasks assessed/performed Overall Cognitive Status: Within Functional Limits for tasks assessed                                        General Comments      Exercises     Assessment/Plan    PT Assessment Patent does not need any further PT services  PT Problem List         PT Treatment Interventions      PT Goals (Current goals can be found in the Care Plan section)  Acute Rehab PT Goals Patient Stated Goal: return home with friends to assist PT Goal Formulation: With patient Time For Goal Achievement: 02/12/20 Potential to Achieve Goals: Good    Frequency     Barriers to discharge        Co-evaluation               AM-PAC PT "6 Clicks" Mobility  Outcome Measure Help needed turning from your back to your side while in a flat bed without using bedrails?: None Help needed moving from lying on your back to sitting on the side of a flat bed without using bedrails?: None Help needed moving to and from a bed to a chair (including a wheelchair)?: None Help needed standing up from a chair using your arms (e.g., wheelchair or bedside chair)?: None Help needed to walk in hospital room?: None Help needed climbing 3-5 steps with a railing? : None 6 Click Score: 24    End of Session   Activity Tolerance: Patient tolerated treatment well Patient left: in bed;with call bell/phone within reach Nurse Communication: Mobility status PT Visit Diagnosis: Unsteadiness on feet (R26.81);Other abnormalities of gait and mobility (R26.89);Muscle weakness (generalized) (M62.81)    Time: 2947-6546 PT Time Calculation (min)  (ACUTE ONLY): 28 min   Charges:   PT Evaluation $PT Eval Moderate Complexity: 1 Mod PT Treatments $Therapeutic Activity: 23-37 mins        2:45 PM, 02/12/20 Lonell Grandchild, MPT Physical Therapist with [facility] [phone] office [phone] mobile phone

## 2020-02-12 NOTE — Discharge Instructions (Signed)
Driving After a Stroke Driving can be dangerous after a stroke because a stroke can cause physical, emotional, cognitive, and behavioral changes. Damage to your brain and other parts of your nervous system may affect your ability to drive. You may have weakness, stiffness, and pain, and have problems moving, talking, seeing, touching, or problem-solving. A stroke can also cause inability to move (paralysis) on one side of your body. Can I return to driving? Ask your health care provider when it is safe for you to drive. Laws on driving after a stroke vary by state. Your health care provider may recommend that you:  Get a driving evaluation to have your vision, thinking, reaction time, and driving skills tested.  Take a driving rehabilitation program for people who have had a stroke.  Take a driving class or a retraining program.   How is driving affected by a stroke? A family member may be the first to notice that it is not safe for you to drive. You may have problems with:  Your vision.  Talking and communicating.  Weakness, pain, and stiffness in your arms or legs.  Responding to changes on the road.  Using the steering wheel, pedals, and other parts of the car.  Thinking while driving.  Judgment on the road. What are some signs that it may not be safe for me to drive? Signs that driving may be unsafe for you include:  Driving too fast or too slowly.  Needing help from others while driving.  Not paying attention to street signs or signals.  Making bad decisions while driving.  Not keeping enough distance between cars.  Drifting into other lanes.  Becoming confused, angry, or frustrated.  Getting lost in familiar places.  Having accidents while driving. What is adaptive equipment? Adaptive equipment refers to devices that can help people who have had a stroke to drive and do other activities. You may need:  A wheelchair-accessible car.  Special hand controls in the  car.  Pedal extensions for the car.  A seat base to help you stay positioned in your seat.  Lifts and ramps to help you get in and out of the car. Summary  Damage to your brain and other parts of your nervous system may affect your ability to drive.  Ask your health care provider when it is safe for you to drive again. You may need to take steps such as getting a driving evaluation or taking a driving class.  A family member may be the first to notice that it is not safe for you to drive.  You may need adaptive equipment to drive safely. This information is not intended to replace advice given to you by your health care provider. Make sure you discuss any questions you have with your health care provider. Document Revised: 12/11/2016 Document Reviewed: 04/06/2016 Elsevier Patient Education  2021 [employer]  

## 2020-02-12 NOTE — ED Notes (Signed)
Pt complains of headache. Says she did not sleep well. Fresh water given.

## 2020-02-12 NOTE — ED Notes (Signed)
Pt O2 dropping to 87% on RA, placed on 2L [place] now @ 98%.

## 2020-02-12 NOTE — Discharge Summary (Signed)
Physician Discharge Summary  Judith Hill [id number] DOB: [birth year] DOA: 02/11/2020  PCP: Barbie BannerWilson, Fred H, MD  Admit date: 02/11/2020 Discharge date: 02/12/2020  Admitted From: Home Disposition:  Home   Recommendations for Outpatient Follow-up:  1. Follow up with PCP in 1-2 weeks 2. Please obtain BMP/CBC in one week    Discharge Condition: Stable CODE STATUS: FULL Diet recommendation: Heart Healthy   Brief/Interim Summary:  70 y.o. female with medical history of lacunar hemorrhagic stroke, COPD, anxiety, depression, hypothyroidism, hypertension presenting with 4-day history of generalized weakness, but right greater than left side.  Patient states that her symptoms began about 4 days prior to this admission after she woke up in the morning.  While walking to the bathroom, the patient felt that her gait was unstable.  She described it as that she was trying to walk for, but was actually walking backwards.  This resulted in a sustained fall but she did not hit her head.  She denies any syncope.  She had associated symptoms including some swelling sensation in her right face and feeling like she had difficulty with her right eye movements and blinking.  In addition, the patient described some word finding difficulties.  She denies any loss of her vision.  Her symptoms overall have been fairly constant over the last 4 days.  As result, her family urged her to come to the emergency department for further evaluation.  The patient did complain of a headache but denied any fevers, chills, coughing, hemoptysis, nausea, vomiting, diarrhea, abdominal pain, dysuria, hematuria. She continues to vape, but she quit smoking about 1 year ago after a 45-pack-year history. In the emergency department, the patient was afebrile and hemodynamically stable with oxygen saturation 98% room air.  BMP was unremarkable except for potassium 3.2.  Serum creatinine was 1.04.  CBC was unremarkable with WBC 10.1,  hemoglobin 14.9, platelets 220,000.  CT of the brain was negative for any hemorrhage or acute findings.  CTA head and neck showed patent vertebral and basilar arteries.  The carotids were patent.  There is upper chest biapical scarring.  Discharge Diagnoses:  Right Hemiparesis/Dysphasia -suspect complicated migraine -PT/OT evaluation -Speech therapy eval -CT brain--neg -MRI brain--no acute stroke; nonspecific T2 hyperintensities -CTA H&N--no LVO;  Carotids, vertebral and basilar arteries patent -Echo--EF 60-65%, no WMA, no PFO, G1DD -LDL--131 -HbA1C--5.6 -Antiplatelet--ASA 81 mg -patient did not want to wait for neurology consult as she is symptomatically better;  She prefers to follow up as outpatient  Essential hypertension -Holding HCTZ to allow for permissive hypertension  Hyperlipidemia -LDL 131 -start lipitor 20 mg daily  Anxiety/depression -Continue home dose of triazolam and Wellbutrin  Hypothyroidism -Continue Synthroid -Check TSH--4.236  Generalized weakness -B12--313 -Folic acid--14.6 -TSH--4.236 -UA neg for pyuria  COPD -Stable on room air -Continue home bronchodilators -ambulatory pulse ox did not show any desaturation   Discharge Instructions   Allergies as of 02/12/2020      Reactions   Penicillins Rash, Other (See Comments)   Has patient had a PCN reaction causing immediate rash, facial/tongue/throat swelling, SOB or lightheadedness with hypotension: No Has patient had a PCN reaction causing severe rash involving mucus membranes or skin necrosis: No Has patient had a PCN reaction that required hospitalization: No Has patient had a PCN reaction occurring within the last 10 years: No If all of the above answers are "NO", then may proceed with Cephalosporin use.      Medication List    STOP taking these medications  hydrochlorothiazide 12.5 MG tablet Commonly known as: HYDRODIURIL   ibuprofen 200 MG tablet Commonly known as: ADVIL      TAKE these medications   ALPRAZolam 1 MG tablet Commonly known as: XANAX Take 0.5-1 mg by mouth 3 (three) times daily as needed for anxiety.   aspirin 81 MG EC tablet Commonly known as: Aspirin 81 Take 1 tablet (81 mg total) by mouth daily. Swallow whole.   atorvastatin 20 MG tablet Commonly known as: LIPITOR Take 1 tablet (20 mg total) by mouth daily.   buPROPion 300 MG 24 hr tablet Commonly known as: WELLBUTRIN XL Take 300 mg by mouth daily.   CoQ10 100 MG Caps Take 100 mg by mouth daily.   Euthyrox 50 MCG tablet Generic drug: levothyroxine Take 50 mcg by mouth daily.   Fluticasone-Salmeterol 250-50 MCG/DOSE Aepb Commonly known as: ADVAIR Inhale 1 puff into the lungs daily.   HYDROcodone-acetaminophen 10-325 MG tablet Commonly known as: NORCO Take 1 tablet by mouth 3 (three) times daily as needed. for pain   meclizine 25 MG tablet Commonly known as: ANTIVERT Take 25 mg by mouth 2 (two) times daily as needed for dizziness.   Healthy Eyes/Lutein Tabs Take 1 tablet by mouth daily.   multivitamins ther. w/minerals Tabs tablet Take 1 tablet by mouth daily.   Spiriva Respimat 1.25 MCG/ACT Aers Generic drug: Tiotropium Bromide Monohydrate Inhale 2 puffs into the lungs daily.   vitamin C 500 MG tablet Commonly known as: ASCORBIC ACID Take 500 mg by mouth daily.   vitamin E 200 UNIT capsule Take 330 Units by mouth daily.       Allergies  Allergen Reactions   Penicillins Rash and Other (See Comments)    Has patient had a PCN reaction causing immediate rash, facial/tongue/throat swelling, SOB or lightheadedness with hypotension: No Has patient had a PCN reaction causing severe rash involving mucus membranes or skin necrosis: No Has patient had a PCN reaction that required hospitalization: No Has patient had a PCN reaction occurring within the last 10 years: No If all of the above answers are "NO", then may proceed with Cephalosporin use.      Consultations:  none   Procedures/Studies: CT Angio Head W or Wo Contrast  Result Date: 02/11/2020 CLINICAL DATA:  Difficulty speaking with right-sided weakness for 4 days EXAM: CT ANGIOGRAPHY HEAD AND NECK TECHNIQUE: Multidetector CT imaging of the head and neck was performed using the standard protocol during bolus administration of intravenous contrast. Multiplanar CT image reconstructions and MIPs were obtained to evaluate the vascular anatomy. Carotid stenosis measurements (when applicable) are obtained utilizing NASCET criteria, using the distal internal carotid diameter as the denominator. CONTRAST:  40mL OMNIPAQUE IOHEXOL 350 MG/ML SOLN COMPARISON:  Head CT from earlier today. FINDINGS: CTA NECK FINDINGS Aortic arch: Essentially not covered. Right carotid system: Mild low-density plaque at the bifurcation without flow reducing stenosis or ulceration. Negative for dissection. Left carotid system: Mild low-density plaque at the bifurcation. No stenosis or ulceration. Vertebral arteries: No proximal subclavian stenosis (the ostia are not covered). Left dominant vertebral artery. Both vertebral arteries are smooth and widely patent to the dura. 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 No acute or focal finding. Other neck: Enlarged right thyroid from nodule/s. A right-sided nodule may measure up to 3.2 cm. Smaller left thyroid nodule posteriorly and measuring 14 mm. Upper chest: Biapical interstitial reticulation/fibrotic appearance. Review of the MIP images confirms the above findings CTA HEAD FINDINGS Anterior circulation: The left ICA is  smaller than the right in the setting of fetal type right PCA. No branch occlusion, beading, aneurysm, or flow reducing stenosis. Mild calcified plaque is seen along the carotid siphons. Posterior circulation: Left dominant vertebral artery. The vertebral and basilar arteries are smooth and widely patent. Fetal type right PCA. Venous  sinuses: Diffusely patent. Anatomic variants: As above Review of the MIP images confirms the above findings IMPRESSION: 1. No emergent finding. 2. Mild atherosclerosis without flow limiting stenosis in the head and neck. 3. Nodular thyroid. Recommend thyroid US.(Ref: J Am Coll Radiol. 2015 Feb;12(2): 143-50). Electronically Signed   By: Monte Fantasia M.D.   On: 02/11/2020 10:58   DG Chest 2 View  Result Date: 02/11/2020 CLINICAL DATA:  Right-sided weakness. Facial weakness. Loss of balance for the past 4 days. EXAM: CHEST - 2 VIEW COMPARISON:  04/15/2018 FINDINGS: Normal sized heart. Clear lungs. The lungs remain hyperexpanded with diffuse prominence of the interstitial markings. No pleural fluid or Kerley lines. Thoracolumbar spine degenerative changes. IMPRESSION: No acute abnormality. COPD. Electronically Signed   By: Claudie Revering M.D.   On: 02/11/2020 13:38   CT HEAD WO CONTRAST  Result Date: 02/11/2020 CLINICAL DATA:  Difficulty speaking and right-sided weakness for 4 days EXAM: CT HEAD WITHOUT CONTRAST TECHNIQUE: Contiguous axial images were obtained from the base of the skull through the vertex without intravenous contrast. COMPARISON:  Brain MRI 10/10/2012 FINDINGS: Brain: No evidence of acute infarction, hemorrhage, hydrocephalus, extra-axial collection or mass lesion/mass effect. Vascular: No hyperdense vessel or unexpected calcification. High-density appearance at the right carotid terminus on axial slices is from volume averaging by reformats. Skull: Normal. Negative for fracture or focal lesion. Sinuses/Orbits: No acute finding.  Bilateral cataract resection IMPRESSION: No hemorrhage or visible infarct. Electronically Signed   By: Monte Fantasia M.D.   On: 02/11/2020 10:08   CT Angio Neck W and/or Wo Contrast  Result Date: 02/11/2020 CLINICAL DATA:  Difficulty speaking with right-sided weakness for 4 days EXAM: CT ANGIOGRAPHY HEAD AND NECK TECHNIQUE: Multidetector CT imaging of the head  and neck was performed using the standard protocol during bolus administration of intravenous contrast. Multiplanar CT image reconstructions and MIPs were obtained to evaluate the vascular anatomy. Carotid stenosis measurements (when applicable) are obtained utilizing NASCET criteria, using the distal internal carotid diameter as the denominator. CONTRAST:  35mL OMNIPAQUE IOHEXOL 350 MG/ML SOLN COMPARISON:  Head CT from earlier today. FINDINGS: CTA NECK FINDINGS Aortic arch: Essentially not covered. Right carotid system: Mild low-density plaque at the bifurcation without flow reducing stenosis or ulceration. Negative for dissection. Left carotid system: Mild low-density plaque at the bifurcation. No stenosis or ulceration. Vertebral arteries: No proximal subclavian stenosis (the ostia are not covered). Left dominant vertebral artery. Both vertebral arteries are smooth and widely patent to the dura. 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 No acute or focal finding. Other neck: Enlarged right thyroid from nodule/s. A right-sided nodule may measure up to 3.2 cm. Smaller left thyroid nodule posteriorly and measuring 14 mm. Upper chest: Biapical interstitial reticulation/fibrotic appearance. Review of the MIP images confirms the above findings CTA HEAD FINDINGS Anterior circulation: The left ICA is smaller than the right in the setting of fetal type right PCA. No branch occlusion, beading, aneurysm, or flow reducing stenosis. Mild calcified plaque is seen along the carotid siphons. Posterior circulation: Left dominant vertebral artery. The vertebral and basilar arteries are smooth and widely patent. Fetal type right PCA. Venous sinuses: Diffusely patent. Anatomic variants: As above Review  of the MIP images confirms the above findings IMPRESSION: 1. No emergent finding. 2. Mild atherosclerosis without flow limiting stenosis in the head and neck. 3. Nodular thyroid. Recommend thyroid US.(Ref: J Am  Coll Radiol. 2015 Feb;12(2): 143-50). Electronically Signed   By: Monte Fantasia M.D.   On: 02/11/2020 10:58   MR BRAIN WO CONTRAST  Result Date: 02/12/2020 CLINICAL DATA:  Neuro deficit, acute, stroke suspected. Right hemiparesis. Facial weakness. EXAM: MRI HEAD WITHOUT CONTRAST TECHNIQUE: Multiplanar, multiecho pulse sequences of the brain and surrounding structures were obtained without intravenous contrast. COMPARISON:  CT head without contrast 02/11/2020. MR head without contrast 10/10/2012 FINDINGS: Brain: No acute infarct, hemorrhage, or mass lesion is present. The ventricles are of normal size. Focal area of susceptibility in the right cerebellum is stable, likely remote hemorrhage. No new hemorrhage is present. The ventricles are of normal size. No significant extraaxial fluid collection is present. Scattered subcortical T2 hyperintensities bilaterally have increased since 2014. Vascular: Flow is present in the major intracranial arteries. Skull and upper cervical spine: Mild degenerative changes are present at C3-4 and C4-5. Craniocervical junction is normal. Midline structures are unremarkable. Sinuses/Orbits: The paranasal sinuses and mastoid air cells are clear. Bilateral lens replacements are noted. Globes and orbits are otherwise unremarkable. IMPRESSION: 1. No acute intracranial abnormality.  No acute infarct. 2. Scattered subcortical T2 hyperintensities bilaterally are moderately advanced for age. The finding is nonspecific but can be seen in the setting of chronic microvascular ischemia, a demyelinating process such as multiple sclerosis, vasculitis, complicated migraine headaches, or as the sequelae of a prior infectious or inflammatory process. Electronically Signed   By: San Morelle M.D.   On: 02/12/2020 10:15   ECHOCARDIOGRAM COMPLETE  Result Date: 02/11/2020    ECHOCARDIOGRAM REPORT   Patient Name:   Judith Hill Date of Exam: 02/11/2020 Medical Rec #:  [id number]         Height:       66.0 in Accession #:    [id number]       Weight:       228.0 lb Date of Birth:  Sep 22, 1950        BSA:          2.114 m Patient Age:    35 years         BP:           109/98 mmHg Patient Gender: F                HR:           82 bpm. Exam Location:  [facility] Procedure: 2D Echo, Cardiac Doppler and Color Doppler Indications:    Stroke 434.91 / I163.9  History:        Patient has prior history of Echocardiogram examinations, most                 recent 05/29/2013. COPD and Stroke; Risk Factors:Current Smoker.  Sonographer:    Vickie Epley RDCS Referring Phys: [phone] Kori Goins IMPRESSIONS  1. Left ventricular ejection fraction, by estimation, is 60 to 65%. The left ventricle has normal function. The left ventricle has no regional wall motion abnormalities. Left ventricular diastolic parameters are consistent with Grade I diastolic dysfunction (impaired relaxation).  2. Right ventricular systolic function is normal. The right ventricular size is normal.  3. The mitral valve is normal in structure. No evidence of mitral valve regurgitation. No evidence of mitral stenosis.  4. The aortic valve is normal in structure.  Aortic valve regurgitation is not visualized. No aortic stenosis is present.  5. The inferior vena cava is normal in size with greater than 50% respiratory variability, suggesting right atrial pressure of 3 mmHg. FINDINGS  Left Ventricle: Left ventricular ejection fraction, by estimation, is 60 to 65%. The left ventricle has normal function. The left ventricle has no regional wall motion abnormalities. The left ventricular internal cavity size was normal in size. There is  no left ventricular hypertrophy. Left ventricular diastolic parameters are consistent with Grade I diastolic dysfunction (impaired relaxation). Normal left ventricular filling pressure. Right Ventricle: The right ventricular size is normal. No increase in right ventricular wall thickness. Right ventricular systolic function is  normal. Left Atrium: Left atrial size was normal in size. Right Atrium: Right atrial size was normal in size. Pericardium: There is no evidence of pericardial effusion. Mitral Valve: The mitral valve is normal in structure. No evidence of mitral valve regurgitation. No evidence of mitral valve stenosis. Tricuspid Valve: The tricuspid valve is normal in structure. Tricuspid valve regurgitation is not demonstrated. No evidence of tricuspid stenosis. Aortic Valve: The aortic valve is normal in structure. Aortic valve regurgitation is not visualized. No aortic stenosis is present. Pulmonic Valve: The pulmonic valve was normal in structure. Pulmonic valve regurgitation is not visualized. No evidence of pulmonic stenosis. Aorta: The aortic root is normal in size and structure. Venous: The inferior vena cava is normal in size with greater than 50% respiratory variability, suggesting right atrial pressure of 3 mmHg. IAS/Shunts: No atrial level shunt detected by color flow Doppler.  LEFT VENTRICLE PLAX 2D LVIDd:         4.91 cm     Diastology LVIDs:         3.69 cm     LV e' medial:    4.54 cm/s LV PW:         0.77 cm     LV E/e' medial:  9.8 LV IVS:        0.78 cm     LV e' lateral:   7.14 cm/s LVOT diam:     2.00 cm     LV E/e' lateral: 6.2 LV SV:         50 LV SV Index:   24 LVOT Area:     3.14 cm  LV Volumes (MOD) LV vol d, MOD A2C: 65.0 ml LV vol d, MOD A4C: 61.0 ml LV vol s, MOD A2C: 30.7 ml LV vol s, MOD A4C: 26.9 ml LV SV MOD A2C:     34.3 ml LV SV MOD A4C:     61.0 ml LV SV MOD BP:      35.7 ml RIGHT VENTRICLE RV S prime:     11.30 cm/s TAPSE (M-mode): 2.2 cm LEFT ATRIUM             Index       RIGHT ATRIUM           Index LA diam:        3.00 cm 1.42 cm/m  RA Area:     14.10 cm LA Vol (A2C):   23.2 ml 10.97 ml/m RA Volume:   34.80 ml  16.46 ml/m LA Vol (A4C):   20.0 ml 9.46 ml/m LA Biplane Vol: 21.8 ml 10.31 ml/m  AORTIC VALVE LVOT Vmax:   84.60 cm/s LVOT Vmean:  50.900 cm/s LVOT VTI:    0.159 m  AORTA Ao  Root diam: 3.20 cm MITRAL VALVE MV Area (PHT): 2.95  cm    SHUNTS MV Decel Time: 257 msec    Systemic VTI:  0.16 m MV E velocity: 44.60 cm/s  Systemic Diam: 2.00 cm MV A velocity: 52.70 cm/s MV E/A ratio:  0.85 Mihai Croitoru MD Electronically signed by Sanda Klein MD Signature Date/Time: 02/11/2020/3:37:40 PM    Final          Discharge Exam: Vitals:   02/12/20 0830 02/12/20 1200  BP: 108/66 112/68  Pulse: 81 76  Resp: 18 18  Temp:  98.3 F (36.8 C)  SpO2: 100% 96%   Vitals:   02/12/20 0400 02/12/20 0715 02/12/20 0830 02/12/20 1200  BP: 102/66 124/86 108/66 112/68  Pulse: 93 80 81 76  Resp: 16 15 18 18   Temp:    98.3 F (36.8 C)  TempSrc:    Oral  SpO2: 97% 98% 100% 96%  Weight:      Height:        General: Pt is alert, awake, not in acute distress Cardiovascular: RRR, S1/S2 +, no rubs, no gallops Respiratory: CTA bilaterally, no wheezing, no rhonchi Abdominal: Soft, NT, ND, bowel sounds + Extremities: no edema, no cyanosis -Neuro:  CN II-XII intact, strength 4/5 in RUE, RLE, strength 4/5 LUE, LLE; sensation intact bilateral; no dysmetria; babinski equivocal    The results of significant diagnostics from this hospitalization (including imaging, microbiology, ancillary and laboratory) are listed below for reference.    Significant Diagnostic Studies: CT Angio Head W or Wo Contrast  Result Date: 02/11/2020 CLINICAL DATA:  Difficulty speaking with right-sided weakness for 4 days EXAM: CT ANGIOGRAPHY HEAD AND NECK TECHNIQUE: Multidetector CT imaging of the head and neck was performed using the standard protocol during bolus administration of intravenous contrast. Multiplanar CT image reconstructions and MIPs were obtained to evaluate the vascular anatomy. Carotid stenosis measurements (when applicable) are obtained utilizing NASCET criteria, using the distal internal carotid diameter as the denominator. CONTRAST:  70mL OMNIPAQUE IOHEXOL 350 MG/ML SOLN COMPARISON:  Head CT  from earlier today. FINDINGS: CTA NECK FINDINGS Aortic arch: Essentially not covered. Right carotid system: Mild low-density plaque at the bifurcation without flow reducing stenosis or ulceration. Negative for dissection. Left carotid system: Mild low-density plaque at the bifurcation. No stenosis or ulceration. Vertebral arteries: No proximal subclavian stenosis (the ostia are not covered). Left dominant vertebral artery. Both vertebral arteries are smooth and widely patent to the dura. 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 No acute or focal finding. Other neck: Enlarged right thyroid from nodule/s. A right-sided nodule may measure up to 3.2 cm. Smaller left thyroid nodule posteriorly and measuring 14 mm. Upper chest: Biapical interstitial reticulation/fibrotic appearance. Review of the MIP images confirms the above findings CTA HEAD FINDINGS Anterior circulation: The left ICA is smaller than the right in the setting of fetal type right PCA. No branch occlusion, beading, aneurysm, or flow reducing stenosis. Mild calcified plaque is seen along the carotid siphons. Posterior circulation: Left dominant vertebral artery. The vertebral and basilar arteries are smooth and widely patent. Fetal type right PCA. Venous sinuses: Diffusely patent. Anatomic variants: As above Review of the MIP images confirms the above findings IMPRESSION: 1. No emergent finding. 2. Mild atherosclerosis without flow limiting stenosis in the head and neck. 3. Nodular thyroid. Recommend thyroid US.(Ref: J Am Coll Radiol. 2015 Feb;12(2): 143-50). Electronically Signed   By: Monte Fantasia M.D.   On: 02/11/2020 10:58   DG Chest 2 View  Result Date: 02/11/2020 CLINICAL DATA:  Right-sided weakness. Facial  weakness. Loss of balance for the past 4 days. EXAM: CHEST - 2 VIEW COMPARISON:  04/15/2018 FINDINGS: Normal sized heart. Clear lungs. The lungs remain hyperexpanded with diffuse prominence of the interstitial  markings. No pleural fluid or Kerley lines. Thoracolumbar spine degenerative changes. IMPRESSION: No acute abnormality. COPD. Electronically Signed   By: Claudie Revering M.D.   On: 02/11/2020 13:38   CT HEAD WO CONTRAST  Result Date: 02/11/2020 CLINICAL DATA:  Difficulty speaking and right-sided weakness for 4 days EXAM: CT HEAD WITHOUT CONTRAST TECHNIQUE: Contiguous axial images were obtained from the base of the skull through the vertex without intravenous contrast. COMPARISON:  Brain MRI 10/10/2012 FINDINGS: Brain: No evidence of acute infarction, hemorrhage, hydrocephalus, extra-axial collection or mass lesion/mass effect. Vascular: No hyperdense vessel or unexpected calcification. High-density appearance at the right carotid terminus on axial slices is from volume averaging by reformats. Skull: Normal. Negative for fracture or focal lesion. Sinuses/Orbits: No acute finding.  Bilateral cataract resection IMPRESSION: No hemorrhage or visible infarct. Electronically Signed   By: Monte Fantasia M.D.   On: 02/11/2020 10:08   CT Angio Neck W and/or Wo Contrast  Result Date: 02/11/2020 CLINICAL DATA:  Difficulty speaking with right-sided weakness for 4 days EXAM: CT ANGIOGRAPHY HEAD AND NECK TECHNIQUE: Multidetector CT imaging of the head and neck was performed using the standard protocol during bolus administration of intravenous contrast. Multiplanar CT image reconstructions and MIPs were obtained to evaluate the vascular anatomy. Carotid stenosis measurements (when applicable) are obtained utilizing NASCET criteria, using the distal internal carotid diameter as the denominator. CONTRAST:  62mL OMNIPAQUE IOHEXOL 350 MG/ML SOLN COMPARISON:  Head CT from earlier today. FINDINGS: CTA NECK FINDINGS Aortic arch: Essentially not covered. Right carotid system: Mild low-density plaque at the bifurcation without flow reducing stenosis or ulceration. Negative for dissection. Left carotid system: Mild low-density plaque  at the bifurcation. No stenosis or ulceration. Vertebral arteries: No proximal subclavian stenosis (the ostia are not covered). Left dominant vertebral artery. Both vertebral arteries are smooth and widely patent to the dura. 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 No acute or focal finding. Other neck: Enlarged right thyroid from nodule/s. A right-sided nodule may measure up to 3.2 cm. Smaller left thyroid nodule posteriorly and measuring 14 mm. Upper chest: Biapical interstitial reticulation/fibrotic appearance. Review of the MIP images confirms the above findings CTA HEAD FINDINGS Anterior circulation: The left ICA is smaller than the right in the setting of fetal type right PCA. No branch occlusion, beading, aneurysm, or flow reducing stenosis. Mild calcified plaque is seen along the carotid siphons. Posterior circulation: Left dominant vertebral artery. The vertebral and basilar arteries are smooth and widely patent. Fetal type right PCA. Venous sinuses: Diffusely patent. Anatomic variants: As above Review of the MIP images confirms the above findings IMPRESSION: 1. No emergent finding. 2. Mild atherosclerosis without flow limiting stenosis in the head and neck. 3. Nodular thyroid. Recommend thyroid US.(Ref: J Am Coll Radiol. 2015 Feb;12(2): 143-50). Electronically Signed   By: Monte Fantasia M.D.   On: 02/11/2020 10:58   MR BRAIN WO CONTRAST  Result Date: 02/12/2020 CLINICAL DATA:  Neuro deficit, acute, stroke suspected. Right hemiparesis. Facial weakness. EXAM: MRI HEAD WITHOUT CONTRAST TECHNIQUE: Multiplanar, multiecho pulse sequences of the brain and surrounding structures were obtained without intravenous contrast. COMPARISON:  CT head without contrast 02/11/2020. MR head without contrast 10/10/2012 FINDINGS: Brain: No acute infarct, hemorrhage, or mass lesion is present. The ventricles are of normal size. Focal area of  susceptibility in the right cerebellum is stable, likely  remote hemorrhage. No new hemorrhage is present. The ventricles are of normal size. No significant extraaxial fluid collection is present. Scattered subcortical T2 hyperintensities bilaterally have increased since 2014. Vascular: Flow is present in the major intracranial arteries. Skull and upper cervical spine: Mild degenerative changes are present at C3-4 and C4-5. Craniocervical junction is normal. Midline structures are unremarkable. Sinuses/Orbits: The paranasal sinuses and mastoid air cells are clear. Bilateral lens replacements are noted. Globes and orbits are otherwise unremarkable. IMPRESSION: 1. No acute intracranial abnormality.  No acute infarct. 2. Scattered subcortical T2 hyperintensities bilaterally are moderately advanced for age. The finding is nonspecific but can be seen in the setting of chronic microvascular ischemia, a demyelinating process such as multiple sclerosis, vasculitis, complicated migraine headaches, or as the sequelae of a prior infectious or inflammatory process. Electronically Signed   By: San Morelle M.D.   On: 02/12/2020 10:15   ECHOCARDIOGRAM COMPLETE  Result Date: 02/11/2020    ECHOCARDIOGRAM REPORT   Patient Name:   Judith Hill Date of Exam: 02/11/2020 Medical Rec #:  [id number]        Height:       66.0 in Accession #:    [id number]       Weight:       228.0 lb Date of Birth:  [birth year]        BSA:          2.114 m Patient Age:    17 years         BP:           109/98 mmHg Patient Gender: F                HR:           82 bpm. Exam Location:  [facility] Procedure: 2D Echo, Cardiac Doppler and Color Doppler Indications:    Stroke 434.91 / I163.9  History:        Patient has prior history of Echocardiogram examinations, most                 recent 05/29/2013. COPD and Stroke; Risk Factors:Current Smoker.  Sonographer:    Vickie Epley RDCS Referring Phys: [phone] Ciarra Braddy IMPRESSIONS  1. Left ventricular ejection fraction, by estimation, is 60 to 65%. The left  ventricle has normal function. The left ventricle has no regional wall motion abnormalities. Left ventricular diastolic parameters are consistent with Grade I diastolic dysfunction (impaired relaxation).  2. Right ventricular systolic function is normal. The right ventricular size is normal.  3. The mitral valve is normal in structure. No evidence of mitral valve regurgitation. No evidence of mitral stenosis.  4. The aortic valve is normal in structure. Aortic valve regurgitation is not visualized. No aortic stenosis is present.  5. The inferior vena cava is normal in size with greater than 50% respiratory variability, suggesting right atrial pressure of 3 mmHg. FINDINGS  Left Ventricle: Left ventricular ejection fraction, by estimation, is 60 to 65%. The left ventricle has normal function. The left ventricle has no regional wall motion abnormalities. The left ventricular internal cavity size was normal in size. There is  no left ventricular hypertrophy. Left ventricular diastolic parameters are consistent with Grade I diastolic dysfunction (impaired relaxation). Normal left ventricular filling pressure. Right Ventricle: The right ventricular size is normal. No increase in right ventricular wall thickness. Right ventricular systolic function is normal. Left Atrium: Left atrial size was normal in  size. Right Atrium: Right atrial size was normal in size. Pericardium: There is no evidence of pericardial effusion. Mitral Valve: The mitral valve is normal in structure. No evidence of mitral valve regurgitation. No evidence of mitral valve stenosis. Tricuspid Valve: The tricuspid valve is normal in structure. Tricuspid valve regurgitation is not demonstrated. No evidence of tricuspid stenosis. Aortic Valve: The aortic valve is normal in structure. Aortic valve regurgitation is not visualized. No aortic stenosis is present. Pulmonic Valve: The pulmonic valve was normal in structure. Pulmonic valve regurgitation is not  visualized. No evidence of pulmonic stenosis. Aorta: The aortic root is normal in size and structure. Venous: The inferior vena cava is normal in size with greater than 50% respiratory variability, suggesting right atrial pressure of 3 mmHg. IAS/Shunts: No atrial level shunt detected by color flow Doppler.  LEFT VENTRICLE PLAX 2D LVIDd:         4.91 cm     Diastology LVIDs:         3.69 cm     LV e' medial:    4.54 cm/s LV PW:         0.77 cm     LV E/e' medial:  9.8 LV IVS:        0.78 cm     LV e' lateral:   7.14 cm/s LVOT diam:     2.00 cm     LV E/e' lateral: 6.2 LV SV:         50 LV SV Index:   24 LVOT Area:     3.14 cm  LV Volumes (MOD) LV vol d, MOD A2C: 65.0 ml LV vol d, MOD A4C: 61.0 ml LV vol s, MOD A2C: 30.7 ml LV vol s, MOD A4C: 26.9 ml LV SV MOD A2C:     34.3 ml LV SV MOD A4C:     61.0 ml LV SV MOD BP:      35.7 ml RIGHT VENTRICLE RV S prime:     11.30 cm/s TAPSE (M-mode): 2.2 cm LEFT ATRIUM             Index       RIGHT ATRIUM           Index LA diam:        3.00 cm 1.42 cm/m  RA Area:     14.10 cm LA Vol (A2C):   23.2 ml 10.97 ml/m RA Volume:   34.80 ml  16.46 ml/m LA Vol (A4C):   20.0 ml 9.46 ml/m LA Biplane Vol: 21.8 ml 10.31 ml/m  AORTIC VALVE LVOT Vmax:   84.60 cm/s LVOT Vmean:  50.900 cm/s LVOT VTI:    0.159 m  AORTA Ao Root diam: 3.20 cm MITRAL VALVE MV Area (PHT): 2.95 cm    SHUNTS MV Decel Time: 257 msec    Systemic VTI:  0.16 m MV E velocity: 44.60 cm/s  Systemic Diam: 2.00 cm MV A velocity: 52.70 cm/s MV E/A ratio:  0.85 Mihai Croitoru MD Electronically signed by Sanda Klein MD Signature Date/Time: 02/11/2020/3:37:40 PM    Final      Microbiology: Recent Results (from the past 240 hour(s))  SARS Coronavirus 2 by RT PCR (hospital order, performed in [facility] hospital lab) Nasopharyngeal Nasopharyngeal Swab     Status: None   Collection Time: 02/11/20  9:59 AM   Specimen: Nasopharyngeal Swab  Result Value Ref Range Status   SARS Coronavirus 2 NEGATIVE NEGATIVE Final     Comment: (NOTE) SARS-CoV-2 target nucleic  acids are NOT DETECTED.  The SARS-CoV-2 RNA is generally detectable in upper and lower respiratory specimens during the acute phase of infection. The lowest concentration of SARS-CoV-2 viral copies this assay can detect is 250 copies / mL. A negative result does not preclude SARS-CoV-2 infection and should not be used as the sole basis for treatment or other patient management decisions.  A negative result may occur with improper specimen collection / handling, submission of specimen other than nasopharyngeal swab, presence of viral mutation(s) within the areas targeted by this assay, and inadequate number of viral copies (<250 copies / mL). A negative result must be combined with clinical observations, patient history, and epidemiological information.  Fact Sheet for Patients:   StrictlyIdeas.no  Fact Sheet for Healthcare Providers: BankingDealers.co.za  This test is not yet approved or  cleared by the [place] FDA and has been authorized for detection and/or diagnosis of SARS-CoV-2 by FDA under an Emergency Use Authorization (EUA).  This EUA will remain in effect (meaning this test can be used) for the duration of the COVID-19 declaration under Section 564(b)(1) of the Act, 21 U.S.C. section 360bbb-3(b)(1), unless the authorization is terminated or revoked sooner.  Performed at [facility], [address], [place], [place] [address]      Labs: Basic Metabolic Panel: Recent Labs  Lab 02/11/20 0936  NA 136  K 3.2*  CL 99  CO2 27  GLUCOSE 90  BUN 23  CREATININE 1.04*  CALCIUM 9.5   Liver Function Tests: Recent Labs  Lab 02/11/20 0936  AST 31  ALT 59*  ALKPHOS 85  BILITOT 0.6  PROT 7.5  ALBUMIN 4.3   No results for input(s): LIPASE, AMYLASE in the last 168 hours. No results for input(s): AMMONIA in the last 168 hours. CBC: Recent Labs  Lab 02/11/20 0936  WBC 10.1   NEUTROABS 6.8  HGB 14.9  HCT 46.0  MCV 96.8  PLT 228   Cardiac Enzymes: No results for input(s): CKTOTAL, CKMB, CKMBINDEX, TROPONINI in the last 168 hours. BNP: Invalid input(s): POCBNP CBG: Recent Labs  Lab 02/11/20 0932  GLUCAP 82    Time coordinating discharge:  36 minutes  Signed:  Orson Eva, DO [facility] Pager: [phone] 02/12/2020, 2:35 PM

## 2020-03-26 ENCOUNTER — Other Ambulatory Visit: Payer: Self-pay | Admitting: Family Medicine

## 2020-03-26 DIAGNOSIS — E041 Nontoxic single thyroid nodule: Secondary | ICD-10-CM

## 2020-04-09 ENCOUNTER — Ambulatory Visit
Admission: RE | Admit: 2020-04-09 | Discharge: 2020-04-09 | Disposition: A | Discharge status: Home / Self Care | Payer: Medicare Other | Source: Ambulatory Visit | Attending: Family Medicine | Admitting: Family Medicine

## 2020-04-09 DIAGNOSIS — E041 Nontoxic single thyroid nodule: Secondary | ICD-10-CM

## 2020-04-10 ENCOUNTER — Other Ambulatory Visit: Payer: Self-pay | Admitting: Family Medicine

## 2020-04-10 DIAGNOSIS — E041 Nontoxic single thyroid nodule: Secondary | ICD-10-CM

## 2020-04-25 ENCOUNTER — Ambulatory Visit
Admission: RE | Admit: 2020-04-25 | Discharge: 2020-04-25 | Disposition: A | Discharge status: Home / Self Care | Payer: Medicare Other | Source: Ambulatory Visit | Attending: Family Medicine | Admitting: Family Medicine

## 2020-04-25 ENCOUNTER — Other Ambulatory Visit (HOSPITAL_COMMUNITY)
Admission: RE | Admit: 2020-04-25 | Discharge: 2020-04-25 | Disposition: A | Discharge status: Home / Self Care | Payer: Medicare Other | Source: Ambulatory Visit | Attending: Family Medicine | Admitting: Family Medicine

## 2020-04-25 DIAGNOSIS — E041 Nontoxic single thyroid nodule: Secondary | ICD-10-CM

## 2020-04-26 LAB — CYTOLOGY - NON PAP

## 2021-03-03 IMAGING — MR MR HEAD W/O CM
11 of 12 series · 40 of 48 positions shown · non-contrast
Comparison: CT head without contrast 02/11/2020. MR head without
contrast 10/10/2012

CLINICAL DATA: Neuro deficit, acute, stroke suspected. Right
hemiparesis. Facial weakness.

EXAM:
MRI HEAD WITHOUT CONTRAST
TECHNIQUE: Multiplanar, multiecho pulse sequences of the brain and surrounding
structures were obtained without intravenous contrast.

[Series 5: DWI · axial · 4.0mm · 0.88mm/px · z∈[-55,+85]mm · 4 of 36 slices shown (1 of 6)]
[im 1/36]
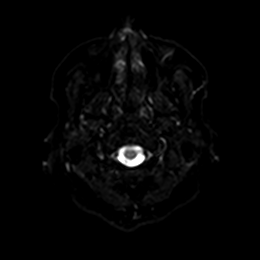
[im 12/36]
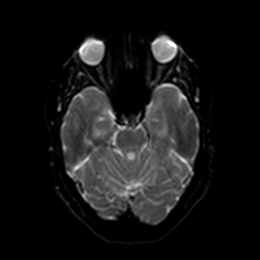
[im 24/36]
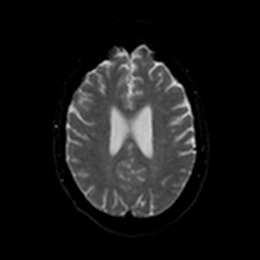
[im 36/36]
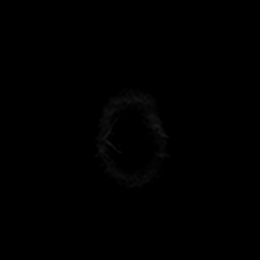

[Series 5: DWI · axial · 4.0mm · 0.88mm/px · z∈[-55,+85]mm · 4 of 36 slices shown (2 of 6)]
[im 1/36]
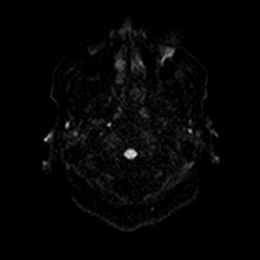
[im 12/36]
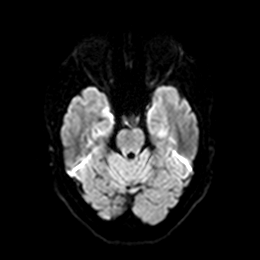
[im 24/36]
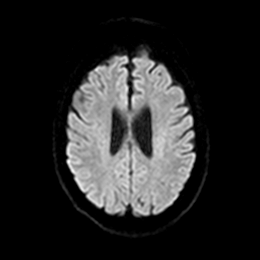
[im 36/36]
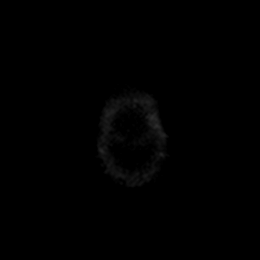

[Series 6: DWI · axial · 4.0mm · 0.88mm/px · z∈[-55,+85]mm · 4 of 36 slices shown (3 of 6)]
[im 1/36]
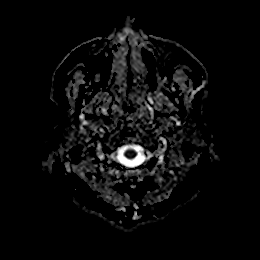
[im 12/36]
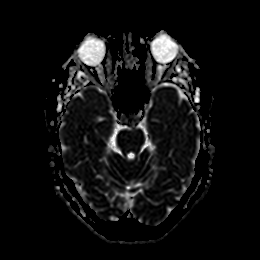
[im 24/36]
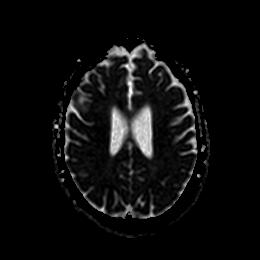
[im 36/36]
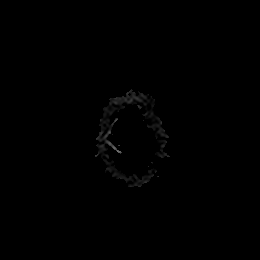

[Series 7: DWI · coronal · 5.0mm · 0.88mm/px · 4 of 28 slices shown (4 of 6)]
[im 1/28]
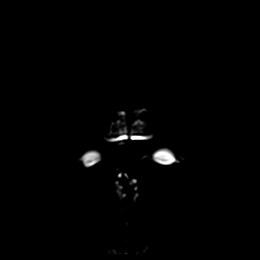
[im 10/28]
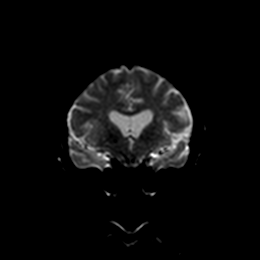
[im 19/28]
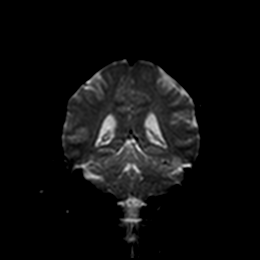
[im 28/28]
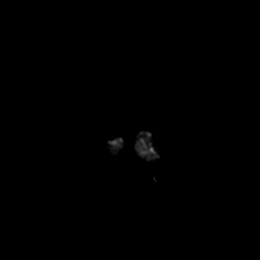

[Series 7: DWI · coronal · 5.0mm · 0.88mm/px · 4 of 28 slices shown (5 of 6)]
[im 1/28]
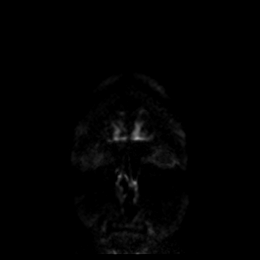
[im 10/28]
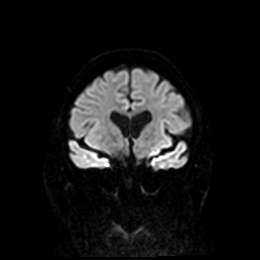
[im 19/28]
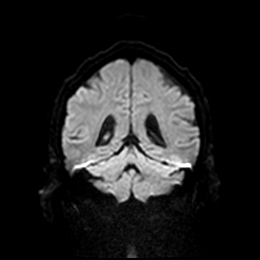
[im 28/28]
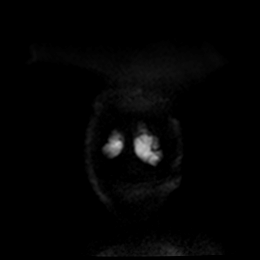

[Series 8: DWI · coronal · 5.0mm · 0.88mm/px · 4 of 28 slices shown (6 of 6)]
[im 1/28]
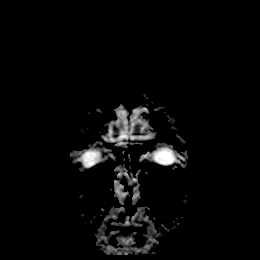
[im 10/28]
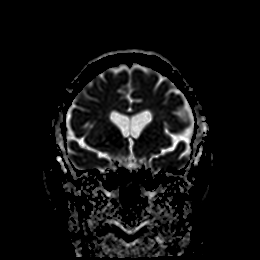
[im 19/28]
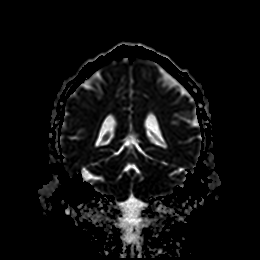
[im 28/28]
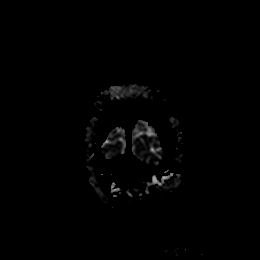

[Series 9: T1 · sagittal · 5.0mm · 0.94mm/px · 3 of 21 slices shown]
[im 1/21]
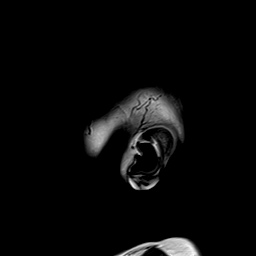
[im 11/21]
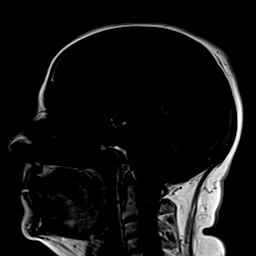
[im 21/21]
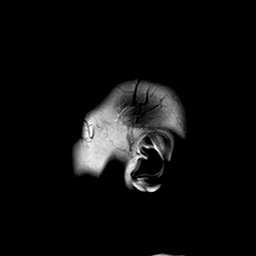

[Series 10: T2 · axial · 5.0mm · 0.72mm/px · z∈[-51,+81]mm · 3 of 20 slices shown (1 of 2)]
[im 1/20]
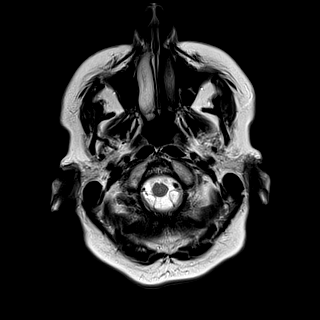
[im 10/20]
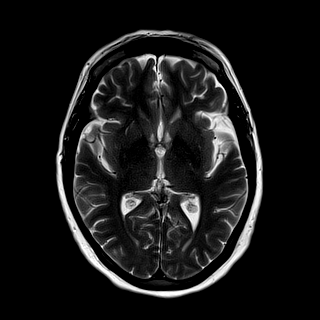
[im 20/20]
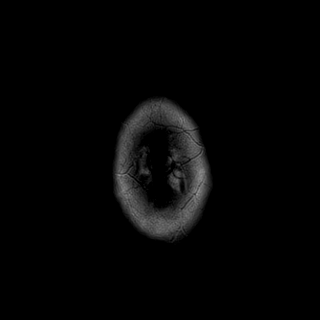

[Series 11: ax hemo · axial · 5.0mm · 0.86mm/px · z∈[-57,+86]mm · 3 of 25 slices shown]
[im 1/25]
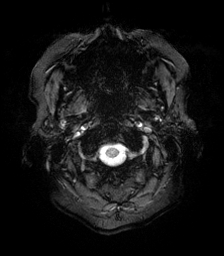
[im 13/25]
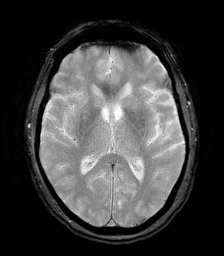
[im 25/25]
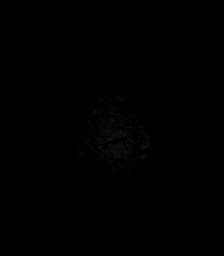

[Series 12: FLAIR · axial · 4.0mm · 0.43mm/px · z∈[-47,+76]mm · 4 of 32 slices shown]
[im 1/32]
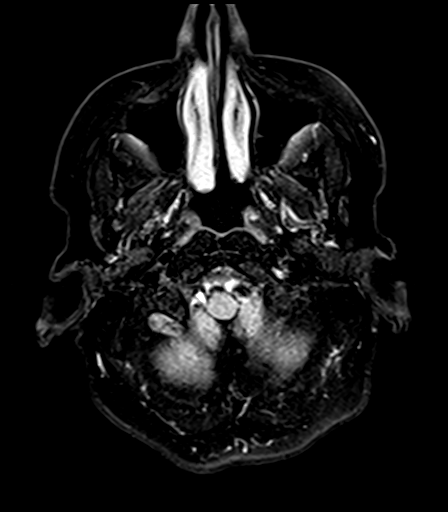
[im 11/32]
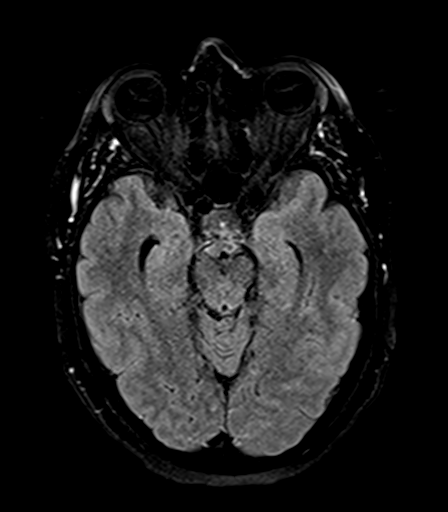
[im 21/32]
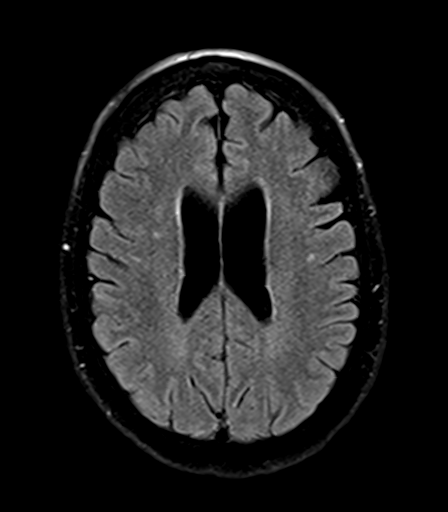
[im 32/32]
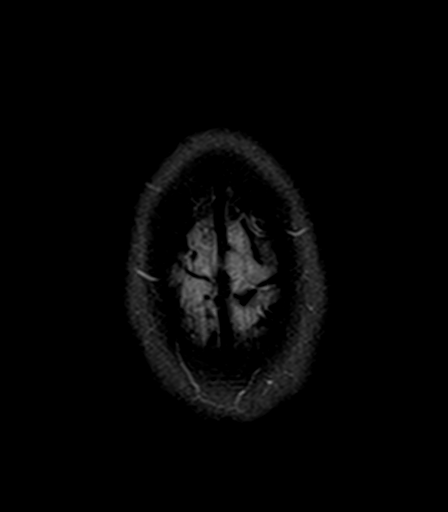

[Series 14: T2 · coronal · 5.0mm · 0.72mm/px · 3 of 26 slices shown (2 of 2)]
[im 1/26]
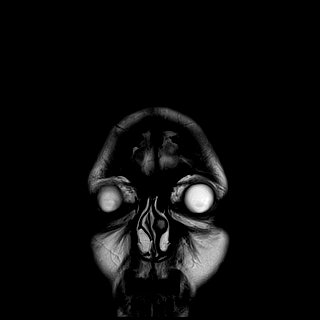
[im 13/26]
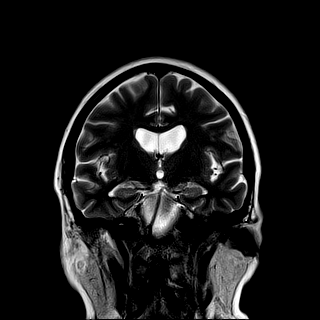
[im 26/26]
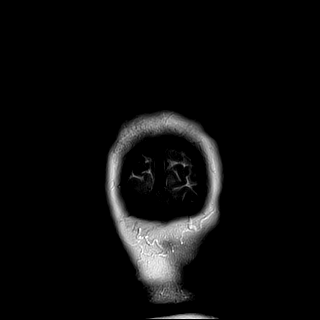

[40 of 48 positions shown; findings below may reference images not displayed]

FINDINGS: Brain: No acute infarct, hemorrhage, or mass lesion is present. The
ventricles are of normal size. Focal area of susceptibility in the
right cerebellum is stable, likely remote hemorrhage. No new
hemorrhage is present. The ventricles are of normal size. No
significant extraaxial fluid collection is present. Scattered
subcortical T2 hyperintensities bilaterally have increased since
5629.

Vascular: Flow is present in the major intracranial arteries.

Skull and upper cervical spine: Mild degenerative changes are
present at C3-4 and C4-5. Craniocervical junction is normal. Midline
structures are unremarkable.

Sinuses/Orbits: The paranasal sinuses and mastoid air cells are
clear. Bilateral lens replacements are noted. Globes and orbits are
otherwise unremarkable.
IMPRESSION: 1. No acute intracranial abnormality.  No acute infarct.
2. Scattered subcortical T2 hyperintensities bilaterally are
moderately advanced for age. The finding is nonspecific but can be
seen in the setting of chronic microvascular ischemia, a
demyelinating process such as multiple sclerosis, vasculitis,
complicated migraine headaches, or as the sequelae of a prior
infectious or inflammatory process.

## 2022-01-27 ENCOUNTER — Encounter: Payer: Self-pay | Admitting: Gastroenterology

## 2023-05-12 ENCOUNTER — Telehealth: Payer: Self-pay | Admitting: Gastroenterology

## 2023-05-12 NOTE — Telephone Encounter (Signed)
 Pt is not a pt of ours, spoke with PCP office and they stated that pt is wanting to move closer and have us  do her colonoscopy. Informed PCP office that a referral would need to be faxed over for the providers to look at and decide. PCP office verbalized understanding.

## 2023-05-12 NOTE — Telephone Encounter (Signed)
 Pt left voicemail to call her called pt back left her voicemail

## 2023-06-03 ENCOUNTER — Encounter: Payer: Self-pay | Admitting: Gastroenterology

## 2023-06-03 ENCOUNTER — Other Ambulatory Visit: Payer: Self-pay | Admitting: *Deleted

## 2023-06-03 ENCOUNTER — Ambulatory Visit (INDEPENDENT_AMBULATORY_CARE_PROVIDER_SITE_OTHER): Admitting: Gastroenterology

## 2023-06-03 VITALS — BP 115/78 | HR 102 | Temp 97.7°F | Ht 66.0 in | Wt 222.8 lb

## 2023-06-03 DIAGNOSIS — K5909 Other constipation: Secondary | ICD-10-CM | POA: Diagnosis not present

## 2023-06-03 DIAGNOSIS — K8020 Calculus of gallbladder without cholecystitis without obstruction: Secondary | ICD-10-CM

## 2023-06-03 DIAGNOSIS — R932 Abnormal findings on diagnostic imaging of liver and biliary tract: Secondary | ICD-10-CM

## 2023-06-03 DIAGNOSIS — R1011 Right upper quadrant pain: Secondary | ICD-10-CM

## 2023-06-03 DIAGNOSIS — K8050 Calculus of bile duct without cholangitis or cholecystitis without obstruction: Secondary | ICD-10-CM

## 2023-06-03 DIAGNOSIS — K5900 Constipation, unspecified: Secondary | ICD-10-CM

## 2023-06-03 DIAGNOSIS — Z860101 Personal history of adenomatous and serrated colon polyps: Secondary | ICD-10-CM

## 2023-06-03 NOTE — Addendum Note (Signed)
 Addended by: Alvester Johnson on: 06/03/2023 04:45 PM   Modules accepted: Orders

## 2023-06-03 NOTE — Progress Notes (Signed)
 Referring Provider: Lazoff, Shawn P, DO  Primary Care Physician:  Tura Gaines, MD Primary Gastroenterologist:  Dr. Riley Cheadle  Chief Complaint  Patient presents with   Abdominal Pain    HPI:   Judith Hill is a 73 y.o. female presenting today at the request of Lazoff, Shawn P, DO for RUQ abdominal pain, colon cancer and screening.   Patient reports she developed RUQ pain about 3 weeks ago. Had nausea and vomiting at the same time. States pain stayed constant for 2 days. Has been coming and going every other day. Not always specifically related to meals, but scared to eat at this point.  Sunday night was bad again lasting a couple hours. Terrible nausea, but no vomiting.   Pain radiating to right shoulder blade.   She has had increased frequency of heartburn since RUQ pain has been an issue. Takes tums. Prior to RUQ pain, heartburn was infrequent.   Doesn't feel herself overall.    No brbpr, melena.   NSAIDs: None aside from 81 mg aspirin .   Ultrasound abdomen Limited 06/01/2023 with cholelithiasis.  Very minimally nodular contour of the liver.   Labs 05/27/2023: LFTs, platelets, hemoglobin within normal limits.     Also has chronic contraption. Bms every 4 days. Taking Lizness prn as she is afraid it will cause her to have diarrhea and doesn't want to be somewhere with this.  No BRBPR or melena.   Overdue for colonoscopy.  Last colonoscopy in October 2020 with seven 6-9 mm polyps resected and retrieved, mild diverticulosis in the left colon, internal hemorrhoids.  Pathology with tubular adenomas, sessile serrated polyps, and hyperplastic polyp.  Recommended 3-year surveillance. Paternal gramother passed from colon cancer.    Past Medical History:  Diagnosis Date   Adenomatous polyp of colon 2007   Anxiety    Arthritis    Blood transfusion without reported diagnosis 2009   after spinal fusion- pt states had issues with transfusion- ended up in ICU post transfusion x 9  days    Cataract    removed both eyes with lens implants    Constipation    dulcolax prn OTC-    COPD (chronic obstructive pulmonary disease) (HCC)    CVA (cerebral infarction)    small stroke confirmed on MRI   Depression    Hypothyroidism    Stroke [facility])     Past Surgical History:  Procedure Laterality Date   CATARACT EXTRACTION W/PHACO Right 10/07/2017   Procedure: CATARACT EXTRACTION PHACO AND INTRAOCULAR LENS PLACEMENT (IOC);  Surgeon: Tarri Farm, MD;  Location: [facility];  Service: Ophthalmology;  Laterality: Right;  CDE: 4.21   CATARACT EXTRACTION W/PHACO Left 10/29/2017   Procedure: CATARACT EXTRACTION PHACO AND INTRAOCULAR LENS PLACEMENT LEFT EYE CDE=4.77;  Surgeon: Tarri Farm, MD;  Location: [facility];  Service: Ophthalmology;  Laterality: Left;  left   COLONOSCOPY     fracture arm  1965   compund fracture right arm; 7 surgeries with bone graft and skin graft   POLYPECTOMY     SPINAL FUSION  2009   TONSILLECTOMY      Current Outpatient Medications  Medication Sig Dispense Refill   ALPRAZolam  (XANAX ) 1 MG tablet Take 0.5-1 mg by mouth 3 (three) times daily as needed for anxiety.      aspirin  (ASPIRIN  81) 81 MG EC tablet Take 1 tablet (81 mg total) by mouth daily. Swallow whole. 30 tablet 12   atorvastatin  (LIPITOR) 20 MG tablet Take 1 tablet (20 mg total)  by mouth daily. 30 tablet 1   buPROPion  (WELLBUTRIN  XL) 300 MG 24 hr tablet Take 300 mg by mouth daily.     Coenzyme Q10 (COQ10) 100 MG CAPS Take 100 mg by mouth daily.     EUTHYROX  50 MCG tablet Take 50 mcg by mouth daily.     Fluticasone-Salmeterol (ADVAIR) 250-50 MCG/DOSE AEPB Inhale 1 puff into the lungs daily.     HYDROcodone -acetaminophen  (NORCO) 10-325 MG tablet Take 1 tablet by mouth 3 (three) times daily as needed. for pain     linaclotide (LINZESS) 72 MCG capsule Take 72 mcg by mouth daily before breakfast. Prn     meclizine  (ANTIVERT ) 25 MG tablet Take 25 mg by mouth 2 (two) times daily as needed for  dizziness.      Multiple Vitamins-Minerals (HEALTHY EYES/LUTEIN) TABS Take 1 tablet by mouth daily.     Multiple Vitamins-Minerals (MULTIVITAMINS THER. W/MINERALS) TABS tablet Take 1 tablet by mouth daily.     SPIRIVA RESPIMAT 1.25 MCG/ACT AERS Inhale 2 puffs into the lungs daily.     vitamin C (ASCORBIC ACID ) 500 MG tablet Take 500 mg by mouth daily.     vitamin E  200 UNIT capsule Take 330 Units by mouth daily.      No current facility-administered medications for this visit.    Allergies as of 06/03/2023 - Review Complete 06/03/2023  Allergen Reaction Noted   Penicillins Rash and Other (See Comments)     Family History  Problem Relation Age of Onset   Colon cancer Paternal Grandmother 49   Esophageal cancer Maternal Grandfather    Stomach cancer Neg Hx    Rectal cancer Neg Hx    Pancreatic cancer Neg Hx    Colon polyps Neg Hx     Social History   Socioeconomic History   Marital status: Single    Spouse name: Not on file   Number of children: Not on file   Years of education: Not on file   Highest education level: Not on file  Occupational History   Not on file  Tobacco Use   Smoking status: Every Day    Current packs/day: 0.50    Average packs/day: 0.5 packs/day for 45.0 years (22.5 ttl pk-yrs)    Types: Cigarettes   Smokeless tobacco: Never  Vaping Use   Vaping status: Never Used  Substance and Sexual Activity   Alcohol use: Yes    Comment: occasionally   Drug use: No   Sexual activity: Yes    Birth control/protection: Post-menopausal  Other Topics Concern   Not on file  Social History Narrative   ** Merged History Encounter **       ** Merged History Encounter **       Social Drivers of Corporate investment banker Strain: Not on file  Food Insecurity: Low Risk  (11/26/2022)   Received from [facility]   Hunger Vital Sign    Worried About Running Out of Food in the Last Year: Never true    Ran Out of Food in the Last Year: Never true   Transportation Needs: No Transportation Needs (11/26/2022)   Received from [facility]    In the past 12 months, has lack of reliable transportation kept you from medical appointments, meetings, work or from getting things needed for daily living? : No  Physical Activity: Not on file  Stress: Not on file  Social Connections: Not on file  Intimate Partner Violence: Not on file  Review of Systems: Gen: Denies any fever, chills, cold or flulike symptoms, presyncope, syncope. CV: Denies chest pain, heart palpitations. Resp: Denies shortness of breath, cough. GI: See HPI. GU : Denies urinary burning, urinary frequency, urinary hesitancy MS: Denies joint pain.  Derm: Denies rash. Psych: Denies depression, anxiety. Heme: See HPI  Physical Exam: BP 115/78 (BP Location: Right Arm, Patient Position: Sitting, Cuff Size: Large)   Pulse (!) 102   Temp 97.7 F (36.5 C) (Temporal)   Ht 5\' 6"  (1.676 m)   Wt 222 lb 12.8 oz (101.1 kg)   BMI 35.96 kg/m  General:   Alert and oriented. Pleasant and cooperative. Well-nourished and well-developed.  Head:  Normocephalic and atraumatic. Eyes:  Without icterus, sclera clear and conjunctiva pink.  Ears:  Normal auditory acuity. Lungs:  Clear to auscultation bilaterally. No wheezes, rales, or rhonchi. No distress.  Heart:  S1, S2 present without murmurs appreciated.  Abdomen:  +BS, soft, and non-distended. Mild to moderate TTP in RUQ. No HSM noted. No guarding or rebound. No masses appreciated.  Rectal:  Deferred  Msk:  Symmetrical without gross deformities. Normal posture. Extremities:  Without edema. Neurologic:  Alert and  oriented x4;  grossly normal neurologically. Skin:  Intact without significant lesions or rashes. Psych:  Normal mood and affect.    Assessment:  73 y.o. female presenting today at the request of Lazoff, Shawn P, DO for RUQ abdominal pain, colon cancer and screening.   RUQ Pain/biliary colic:  3  weeks of intermittent RUQ abdominal pain radiating to right shoulder blade with associated nausea and vomiting, lasting for couple hours at a time.  Suspect this is secondary to symptomatic cholelithiasis/biliary colic.  She had ultrasound on 06/01/2023 that showed cholelithiasis. LFTs wnl on 5/15.  While she has had some increased heartburn since issues since RUQ abdominal pain started, but suspect this is related to gallbladder disease rather than any significant upper GI pathology.   Abnormal liver on ultrasound: Ultrasound 06/01/2023 states, "Query minimally nodular contour" in regards to her liver.  LFTs, platelets within normal limits on 5/17.  She has no symptoms of decompensated liver disease.  Denies any alcohol use, illicit drug use.  May have underlying fatty liver disease/fibrosis, but I suspect this may be an overcall in regards to cirrhosis.  If she undergoes cholecystectomy, recommend liver biopsy for definitive diagnosis.  Patient is agreeable to this.   Constipation:  Chronic.  Exacerbated by chronic pain medications.  No alarm symptoms.  Currently taking Linzess as needed due to Linzess causing diarrhea.  Encouraged her to go ahead and take Linzess every day consistently as diarrhea should taper off in 1-2 weeks.  Ultimately, if Linzess is too strong, she can take MiraLAX daily.  History of adenomatous colon polyps: Overdue for surveillance colonoscopy.  Last colonoscopy 2020 with seven 6-9 mm polyps removed.  Pathology showed tubular adenomas, sessile serrated polyps, hyperplastic polyp.  Recommended 3-year surveillance.  Family history significant for paternal grandmother with colon cancer.    Plan:  Refer to general surgery ASAP for symptomatic cholelithiasis/biliary colic.  Request liver biopsy at the time of cholecystectomy. Recommended ER evaluation if severe, persistent RUQ abdominal pain more than 3-4 hours. Resume Linzess 72 mcg every day.  Diarrhea should taper off in 1-2  weeks. If Linzess is ultimately too strong, start MiraLAX 17 g daily. Will circle back to arranging surveillance colonoscopy after cholecystectomy.  Patient will call our office back to schedule a follow-up appointment after surgery.  Shana Daring, PA-C [facility] 06/03/2023

## 2023-06-03 NOTE — Patient Instructions (Addendum)
 We are placing an urgent referral to general surgery to discuss having her gallbladder removed.  If you develop recurrent right upper abdominal pain that is persistent for 3+ hours and unrelenting, please proceed to the emergency room.  I spoke with Dr. Collene Dawson, one of the general surgeons here in [place] who states you are fine to receive your cortisone injection in your hip.  For your constipation, go ahead and take your Linzess every day.  Diarrhea from this medication should taper off in a couple of weeks.  Linzess ultimately is too strong to take every day, you can take MiraLAX 17 g (1 capful) in 8 ounces of water or other noncarbonated beverage of your choice.  After you have your gallbladder removed, please call the office to schedule a follow-up to arrange a colonoscopy.   If you have any other questions or concerns, do not hesitate to call our office.  Shana Daring, PA-C [facility]

## 2023-06-05 ENCOUNTER — Encounter (HOSPITAL_COMMUNITY): Payer: Self-pay | Admitting: *Deleted

## 2023-06-05 ENCOUNTER — Emergency Department (HOSPITAL_COMMUNITY)

## 2023-06-05 ENCOUNTER — Inpatient Hospital Stay (HOSPITAL_COMMUNITY)
Admission: EM | Admit: 2023-06-05 | Discharge: 2023-06-08 | DRG: 419 | Disposition: A | Discharge status: Home / Self Care | Attending: Family Medicine | Admitting: Family Medicine

## 2023-06-05 ENCOUNTER — Other Ambulatory Visit: Payer: Self-pay

## 2023-06-05 DIAGNOSIS — I10 Essential (primary) hypertension: Secondary | ICD-10-CM | POA: Diagnosis present

## 2023-06-05 DIAGNOSIS — K589 Irritable bowel syndrome without diarrhea: Secondary | ICD-10-CM | POA: Diagnosis present

## 2023-06-05 DIAGNOSIS — Z880 Allergy status to penicillin: Secondary | ICD-10-CM | POA: Diagnosis not present

## 2023-06-05 DIAGNOSIS — F4310 Post-traumatic stress disorder, unspecified: Secondary | ICD-10-CM | POA: Diagnosis present

## 2023-06-05 DIAGNOSIS — Z7951 Long term (current) use of inhaled steroids: Secondary | ICD-10-CM

## 2023-06-05 DIAGNOSIS — Z6835 Body mass index (BMI) 35.0-35.9, adult: Secondary | ICD-10-CM | POA: Diagnosis not present

## 2023-06-05 DIAGNOSIS — F32A Depression, unspecified: Secondary | ICD-10-CM | POA: Diagnosis present

## 2023-06-05 DIAGNOSIS — J45909 Unspecified asthma, uncomplicated: Secondary | ICD-10-CM | POA: Diagnosis present

## 2023-06-05 DIAGNOSIS — Z981 Arthrodesis status: Secondary | ICD-10-CM | POA: Diagnosis not present

## 2023-06-05 DIAGNOSIS — J4489 Other specified chronic obstructive pulmonary disease: Secondary | ICD-10-CM | POA: Diagnosis present

## 2023-06-05 DIAGNOSIS — E039 Hypothyroidism, unspecified: Secondary | ICD-10-CM | POA: Diagnosis present

## 2023-06-05 DIAGNOSIS — F17210 Nicotine dependence, cigarettes, uncomplicated: Secondary | ICD-10-CM | POA: Diagnosis present

## 2023-06-05 DIAGNOSIS — E66812 Obesity, class 2: Secondary | ICD-10-CM | POA: Diagnosis present

## 2023-06-05 DIAGNOSIS — K8070 Calculus of gallbladder and bile duct without cholecystitis without obstruction: Principal | ICD-10-CM | POA: Diagnosis present

## 2023-06-05 DIAGNOSIS — K8020 Calculus of gallbladder without cholecystitis without obstruction: Secondary | ICD-10-CM | POA: Diagnosis not present

## 2023-06-05 DIAGNOSIS — R1010 Upper abdominal pain, unspecified: Principal | ICD-10-CM

## 2023-06-05 DIAGNOSIS — F4323 Adjustment disorder with mixed anxiety and depressed mood: Secondary | ICD-10-CM | POA: Diagnosis present

## 2023-06-05 DIAGNOSIS — Z860101 Personal history of adenomatous and serrated colon polyps: Secondary | ICD-10-CM

## 2023-06-05 DIAGNOSIS — Z79899 Other long term (current) drug therapy: Secondary | ICD-10-CM | POA: Diagnosis not present

## 2023-06-05 DIAGNOSIS — K8050 Calculus of bile duct without cholangitis or cholecystitis without obstruction: Secondary | ICD-10-CM | POA: Diagnosis present

## 2023-06-05 DIAGNOSIS — Z800 Family history of malignant neoplasm of digestive organs: Secondary | ICD-10-CM | POA: Diagnosis not present

## 2023-06-05 DIAGNOSIS — R7401 Elevation of levels of liver transaminase levels: Secondary | ICD-10-CM | POA: Diagnosis present

## 2023-06-05 DIAGNOSIS — K8010 Calculus of gallbladder with chronic cholecystitis without obstruction: Secondary | ICD-10-CM | POA: Diagnosis not present

## 2023-06-05 DIAGNOSIS — E876 Hypokalemia: Secondary | ICD-10-CM | POA: Diagnosis present

## 2023-06-05 DIAGNOSIS — Z7982 Long term (current) use of aspirin: Secondary | ICD-10-CM | POA: Diagnosis not present

## 2023-06-05 DIAGNOSIS — I639 Cerebral infarction, unspecified: Secondary | ICD-10-CM | POA: Diagnosis present

## 2023-06-05 DIAGNOSIS — Z8673 Personal history of transient ischemic attack (TIA), and cerebral infarction without residual deficits: Secondary | ICD-10-CM | POA: Diagnosis not present

## 2023-06-05 DIAGNOSIS — R748 Abnormal levels of other serum enzymes: Secondary | ICD-10-CM | POA: Diagnosis not present

## 2023-06-05 LAB — URINALYSIS, ROUTINE W REFLEX MICROSCOPIC
Bilirubin Urine: NEGATIVE
Glucose, UA: NEGATIVE mg/dL
Hgb urine dipstick: NEGATIVE
Ketones, ur: NEGATIVE mg/dL
Leukocytes,Ua: NEGATIVE
Nitrite: NEGATIVE
Protein, ur: NEGATIVE mg/dL
Specific Gravity, Urine: 1.034 — ABNORMAL HIGH (ref 1.005–1.030)
pH: 6 (ref 5.0–8.0)

## 2023-06-05 LAB — COMPREHENSIVE METABOLIC PANEL WITH GFR
ALT: 15 U/L (ref 0–44)
AST: 15 U/L (ref 15–41)
Albumin: 3.7 g/dL (ref 3.5–5.0)
Alkaline Phosphatase: 84 U/L (ref 38–126)
Anion gap: 5 (ref 5–15)
BUN: 27 mg/dL — ABNORMAL HIGH (ref 8–23)
CO2: 31 mmol/L (ref 22–32)
Calcium: 9.3 mg/dL (ref 8.9–10.3)
Chloride: 101 mmol/L (ref 98–111)
Creatinine, Ser: 1.16 mg/dL — ABNORMAL HIGH (ref 0.44–1.00)
GFR, Estimated: 51 mL/min — ABNORMAL LOW (ref >60)
Glucose, Bld: 95 mg/dL (ref 70–99)
Potassium: 3.3 mmol/L — ABNORMAL LOW (ref 3.5–5.1)
Sodium: 137 mmol/L (ref 135–145)
Total Bilirubin: 0.4 mg/dL (ref 0.0–1.2)
Total Protein: 6.7 g/dL (ref 6.5–8.1)

## 2023-06-05 LAB — CBC
HCT: 43.9 % (ref 36.0–46.0)
Hemoglobin: 14.5 g/dL (ref 12.0–15.0)
MCH: 31.9 pg (ref 26.0–34.0)
MCHC: 33 g/dL (ref 30.0–36.0)
MCV: 96.7 fL (ref 80.0–100.0)
Platelets: 267 10*3/uL (ref 150–400)
RBC: 4.54 MIL/uL (ref 3.87–5.11)
RDW: 13.3 % (ref 11.5–15.5)
WBC: 9.6 10*3/uL (ref 4.0–10.5)
nRBC: 0 % (ref 0.0–0.2)

## 2023-06-05 LAB — DIFFERENTIAL
Abs Immature Granulocytes: 0.05 10*3/uL (ref 0.00–0.07)
Basophils Absolute: 0.1 10*3/uL (ref 0.0–0.1)
Basophils Relative: 1 %
Eosinophils Absolute: 0.1 10*3/uL (ref 0.0–0.5)
Eosinophils Relative: 1 %
Immature Granulocytes: 1 %
Lymphocytes Relative: 25 %
Lymphs Abs: 2.4 10*3/uL (ref 0.7–4.0)
Monocytes Absolute: 0.9 10*3/uL (ref 0.1–1.0)
Monocytes Relative: 9 %
Neutro Abs: 6.2 10*3/uL (ref 1.7–7.7)
Neutrophils Relative %: 63 %

## 2023-06-05 LAB — LIPASE, BLOOD: Lipase: 25 U/L (ref 11–51)

## 2023-06-05 MED ORDER — METRONIDAZOLE 500 MG/100ML IV SOLN
500.0000 mg | Freq: Two times a day (BID) | INTRAVENOUS | Status: DC
  Administered 2023-06-05 – 2023-06-07 (×5): 500 mg via INTRAVENOUS
  Filled 2023-06-05 (×5): qty 100

## 2023-06-05 MED ORDER — ONDANSETRON HCL 4 MG/2ML IJ SOLN
4.0000 mg | Freq: Four times a day (QID) | INTRAMUSCULAR | Status: DC | PRN
  Administered 2023-06-05 – 2023-06-07 (×5): 4 mg via INTRAVENOUS
  Filled 2023-06-05 (×4): qty 2

## 2023-06-05 MED ORDER — ALPRAZOLAM 0.5 MG PO TABS
0.5000 mg | ORAL_TABLET | Freq: Every day | ORAL | Status: DC
  Administered 2023-06-05 – 2023-06-07 (×3): 1 mg via ORAL
  Filled 2023-06-05 (×3): qty 2

## 2023-06-05 MED ORDER — ONDANSETRON HCL 4 MG PO TABS: 4.0000 mg | ORAL_TABLET | Freq: Four times a day (QID) | ORAL | Status: DC | PRN

## 2023-06-05 MED ORDER — POLYETHYLENE GLYCOL 3350 17 G PO PACK: 17.0000 g | PACK | Freq: Every day | ORAL | Status: DC | PRN

## 2023-06-05 MED ORDER — ONDANSETRON HCL 4 MG/2ML IJ SOLN
4.0000 mg | Freq: Once | INTRAMUSCULAR | Status: AC
  Administered 2023-06-05: 4 mg via INTRAVENOUS
  Filled 2023-06-05: qty 2

## 2023-06-05 MED ORDER — HYDROMORPHONE HCL 1 MG/ML IJ SOLN
0.5000 mg | INTRAMUSCULAR | Status: DC | PRN
  Administered 2023-06-06 – 2023-06-07 (×4): 0.5 mg via INTRAVENOUS
  Filled 2023-06-05 (×5): qty 0.5

## 2023-06-05 MED ORDER — SODIUM CHLORIDE 0.9 % IV SOLN
2.0000 g | INTRAVENOUS | Status: DC
  Administered 2023-06-06 – 2023-06-07 (×2): 2 g via INTRAVENOUS
  Filled 2023-06-05 (×2): qty 20

## 2023-06-05 MED ORDER — ENOXAPARIN SODIUM 40 MG/0.4ML IJ SOSY
40.0000 mg | PREFILLED_SYRINGE | INTRAMUSCULAR | Status: DC
  Administered 2023-06-06: 40 mg via SUBCUTANEOUS
  Filled 2023-06-05 (×2): qty 0.4

## 2023-06-05 MED ORDER — UMECLIDINIUM BROMIDE 62.5 MCG/ACT IN AEPB
1.0000 | INHALATION_SPRAY | Freq: Every day | RESPIRATORY_TRACT | Status: DC
Start: 2023-06-06 — End: 2023-06-08
  Administered 2023-06-06 – 2023-06-08 (×2): 1 via RESPIRATORY_TRACT
  Filled 2023-06-05: qty 7

## 2023-06-05 MED ORDER — FLUTICASONE FUROATE-VILANTEROL 200-25 MCG/ACT IN AEPB
1.0000 | INHALATION_SPRAY | Freq: Every day | RESPIRATORY_TRACT | Status: DC
  Administered 2023-06-06 – 2023-06-08 (×2): 1 via RESPIRATORY_TRACT
  Filled 2023-06-05: qty 28

## 2023-06-05 MED ORDER — IOHEXOL 300 MG/ML  SOLN
100.0000 mL | Freq: Once | INTRAMUSCULAR | Status: AC | PRN
  Administered 2023-06-05: 100 mL via INTRAVENOUS

## 2023-06-05 MED ORDER — BUPROPION HCL ER (XL) 300 MG PO TB24
300.0000 mg | ORAL_TABLET | Freq: Every day | ORAL | Status: DC
  Administered 2023-06-06: 300 mg via ORAL
  Filled 2023-06-05 (×2): qty 1

## 2023-06-05 MED ORDER — HYDROMORPHONE HCL 1 MG/ML IJ SOLN
0.5000 mg | Freq: Once | INTRAMUSCULAR | Status: AC
  Administered 2023-06-05: 0.5 mg via INTRAVENOUS
  Filled 2023-06-05: qty 0.5

## 2023-06-05 MED ORDER — SODIUM CHLORIDE 0.9 % IV SOLN
2.0000 g | Freq: Once | INTRAVENOUS | Status: AC
  Administered 2023-06-05: 2 g via INTRAVENOUS
  Filled 2023-06-05: qty 20

## 2023-06-05 MED ORDER — ACETAMINOPHEN 325 MG PO TABS: 650.0000 mg | ORAL_TABLET | Freq: Four times a day (QID) | ORAL | Status: DC | PRN

## 2023-06-05 MED ORDER — ACETAMINOPHEN 650 MG RE SUPP: 650.0000 mg | Freq: Four times a day (QID) | RECTAL | Status: DC | PRN

## 2023-06-05 MED ORDER — ASPIRIN 81 MG PO TBEC
81.0000 mg | DELAYED_RELEASE_TABLET | Freq: Every day | ORAL | Status: DC
  Administered 2023-06-06: 81 mg via ORAL
  Filled 2023-06-05 (×2): qty 1

## 2023-06-05 MED ORDER — ATORVASTATIN CALCIUM 20 MG PO TABS
20.0000 mg | ORAL_TABLET | Freq: Every day | ORAL | Status: DC
  Administered 2023-06-06: 20 mg via ORAL
  Filled 2023-06-05 (×2): qty 1

## 2023-06-05 MED ORDER — POTASSIUM CHLORIDE IN NACL 40-0.9 MEQ/L-% IV SOLN: INTRAVENOUS | Status: DC

## 2023-06-05 MED ORDER — TIOTROPIUM BROMIDE MONOHYDRATE 1.25 MCG/ACT IN AERS: 2.0000 | INHALATION_SPRAY | Freq: Every day | RESPIRATORY_TRACT | Status: DC

## 2023-06-05 MED ORDER — LINACLOTIDE 72 MCG PO CAPS
72.0000 ug | ORAL_CAPSULE | Freq: Every day | ORAL | Status: DC
  Administered 2023-06-06: 72 ug via ORAL
  Filled 2023-06-05 (×4): qty 1

## 2023-06-05 MED ORDER — SODIUM CHLORIDE 0.9 % IV BOLUS
500.0000 mL | Freq: Once | INTRAVENOUS | Status: AC
  Administered 2023-06-05: 500 mL via INTRAVENOUS

## 2023-06-05 NOTE — ED Triage Notes (Signed)
 Pt with abd pain with N/V.  Pt with known gallstones and to have gallbladder surgery soon. + HA

## 2023-06-05 NOTE — Assessment & Plan Note (Signed)
 History of asthma COPD.  Stable.   - Resume home bronchodilator regimen.

## 2023-06-05 NOTE — Assessment & Plan Note (Addendum)
 Presenting with abdominal pain nausea vomiting over the past week.  CT abdomen and pelvis with contrast-cholelithiasis.  Afebrile without leukocytosis.  Normal liver enzymes, lipase normal 25. -EDP talked to Dr. Collene Dawson, will see in consult tomorrow, IV antibiotics - Clear Liquid diet -IV Dilaudid  0.5 every 4 hourly as needed -IV ceftriaxone 2 g daily, IV metronidazole - 500 mL bolus given, continue N/s+ 40kcl 75cc/hr x 15hrs

## 2023-06-05 NOTE — Assessment & Plan Note (Signed)
 On aspirin  and statins -Resume

## 2023-06-05 NOTE — ED Provider Notes (Signed)
 [place] EMERGENCY DEPARTMENT AT [facility] Provider Note   CSN: [id number] Arrival date & time: 06/05/23  1740     History {Add pertinent medical, surgical, social history, OB history to HPI:1} Chief Complaint  Patient presents with   Abdominal Pain    Judith Hill is a 73 y.o. female.  Patient has a history of COPD and a stroke.  She has been having abdominal pain and vomiting for over a week.  Ultrasound shows gallstones   Abdominal Pain      Home Medications Prior to Admission medications   Medication Sig Start Date End Date Taking? Authorizing Provider  ALPRAZolam  (XANAX ) 1 MG tablet Take 0.5-1 mg by mouth 3 (three) times daily as needed for anxiety.  11/06/11   [provider]  aspirin  (ASPIRIN  81) 81 MG EC tablet Take 1 tablet (81 mg total) by mouth daily. Swallow whole. 02/12/20   Demaris Fillers, MD  atorvastatin  (LIPITOR) 20 MG tablet Take 1 tablet (20 mg total) by mouth daily. 02/12/20   Demaris Fillers, MD  buPROPion  (WELLBUTRIN  XL) 300 MG 24 hr tablet Take 300 mg by mouth daily.    [provider]  Coenzyme Q10 (COQ10) 100 MG CAPS Take 100 mg by mouth daily.    [provider]  EUTHYROX  50 MCG tablet Take 50 mcg by mouth daily. 01/07/20   [provider]  Fluticasone-Salmeterol (ADVAIR) 250-50 MCG/DOSE AEPB Inhale 1 puff into the lungs daily. 03/28/18   [provider]  HYDROcodone -acetaminophen  (NORCO) 10-325 MG tablet Take 1 tablet by mouth 3 (three) times daily as needed. for pain 02/06/20   [provider]  linaclotide (LINZESS) 72 MCG capsule Take 72 mcg by mouth daily before breakfast. Prn    [provider]  meclizine  (ANTIVERT ) 25 MG tablet Take 25 mg by mouth 2 (two) times daily as needed for dizziness.  10/10/12   Zackowski, Scott, MD  Multiple Vitamins-Minerals (HEALTHY EYES/LUTEIN) TABS Take 1 tablet by mouth daily.    [provider]  Multiple Vitamins-Minerals (MULTIVITAMINS  THER. W/MINERALS) TABS tablet Take 1 tablet by mouth daily.    [provider]  SPIRIVA RESPIMAT 1.25 MCG/ACT AERS Inhale 2 puffs into the lungs daily. 02/02/20   [provider]  vitamin C (ASCORBIC ACID ) 500 MG tablet Take 500 mg by mouth daily.    [provider]  vitamin E  200 UNIT capsule Take 330 Units by mouth daily.     [provider]      Allergies    Penicillins    Review of Systems   Review of Systems  Gastrointestinal:  Positive for abdominal pain.    Physical Exam Updated Vital Signs BP 127/76   Pulse 78   Temp 98.3 F (36.8 C) (Oral)   Resp 18   Ht 5\' 6"  (1.676 m)   Wt 99.3 kg   SpO2 97%   BMI 35.35 kg/m  Physical Exam  ED Results / Procedures / Treatments   Labs (all labs ordered are listed, but only abnormal results are displayed) Labs Reviewed  COMPREHENSIVE METABOLIC PANEL WITH GFR - Abnormal; Notable for the following components:      Result Value   Potassium 3.3 (*)    BUN 27 (*)    Creatinine, Ser 1.16 (*)    GFR, Estimated 51 (*)    All other components within normal limits  URINALYSIS, ROUTINE W REFLEX MICROSCOPIC - Abnormal; Notable for the following components:   APPearance HAZY (*)  Specific Gravity, Urine 1.034 (*)    All other components within normal limits  LIPASE, BLOOD  CBC  DIFFERENTIAL    EKG None  Radiology CT ABDOMEN PELVIS W CONTRAST Result Date: 06/05/2023 CLINICAL DATA:  Abdominal pain with nausea and vomiting. EXAM: CT ABDOMEN AND PELVIS WITH CONTRAST TECHNIQUE: Multidetector CT imaging of the abdomen and pelvis was performed using the standard protocol following bolus administration of intravenous contrast. RADIATION DOSE REDUCTION: This exam was performed according to the departmental dose-optimization program which includes automated exposure control, adjustment of the mA and/or kV according to patient size and/or use of iterative reconstruction technique. CONTRAST:  100mL OMNIPAQUE   IOHEXOL  300 MG/ML  SOLN COMPARISON:  None Available. FINDINGS: Lower chest: No acute abnormality. Hepatobiliary: No focal liver abnormality is seen. Numerous ill-defined gallstones are seen within an otherwise normal-appearing gallbladder. There is no evidence of gallbladder wall thickening, pericholecystic inflammation or biliary dilatation. Pancreas: Unremarkable. No pancreatic ductal dilatation or surrounding inflammatory changes. Spleen: Normal in size without focal abnormality. Adrenals/Urinary Tract: Adrenal glands are unremarkable. Kidneys are normal, without renal calculi, focal lesion, or hydronephrosis. Bladder is unremarkable. Stomach/Bowel: Stomach is within normal limits. Appendix appears normal. Stool is seen throughout the large bowel. No evidence of bowel wall thickening, distention, or inflammatory changes. Noninflamed diverticula are seen throughout the sigmoid colon. Vascular/Lymphatic: Aortic atherosclerosis. No enlarged abdominal or pelvic lymph nodes. Reproductive: Uterus and bilateral adnexa are unremarkable. Other: No abdominal wall hernia or abnormality. No abdominopelvic ascites. Musculoskeletal: Postoperative changes are seen within the lower lumbar spine. Marked severity multilevel degenerative changes are also present. IMPRESSION: 1. Cholelithiasis. 2. Sigmoid diverticulosis. 3. Postoperative changes within the lower lumbar spine. 4. Aortic atherosclerosis. Electronically Signed   By: Virgle Grime M.D.   On: 06/05/2023 20:37    Procedures Procedures  {Document cardiac monitor, telemetry assessment procedure when appropriate:1}  Medications Ordered in ED Medications  cefTRIAXone (ROCEPHIN) 2 g in sodium chloride  0.9 % 100 mL IVPB (2 g Intravenous New Bag/Given 06/05/23 2142)  sodium chloride  0.9 % bolus 500 mL (0 mLs Intravenous Stopped 06/05/23 1944)  ondansetron  (ZOFRAN ) injection 4 mg (4 mg Intravenous Given 06/05/23 1912)  HYDROmorphone  (DILAUDID ) injection 0.5 mg (0.5  mg Intravenous Given 06/05/23 1913)  iohexol  (OMNIPAQUE ) 300 MG/ML solution 100 mL (100 mLs Intravenous Contrast Given 06/05/23 2007)  ondansetron  (ZOFRAN ) injection 4 mg (4 mg Intravenous Given 06/05/23 2122)    ED Course/ Medical Decision Making/ A&P   {Patient with persistent vomiting and gallstones.  I spoke to general surgery Dr. Collene Dawson and she agrees with admission and starting the patient on some antibiotics.  General surgery will see her tomorrow Click here for ABCD2, HEART and other calculatorsREFRESH Note before signing :1}                              Medical Decision Making Amount and/or Complexity of Data Reviewed Labs: ordered. Radiology: ordered.  Risk Prescription drug management. Decision regarding hospitalization.   Abdominal pain and vomiting with gallstones.  Patient will be admitted to medicine with surgery consulting  {Document critical care time when appropriate:1} {Document review of labs and clinical decision tools ie heart score, Chads2Vasc2 etc:1}  {Document your independent review of radiology images, and any outside records:1} {Document your discussion with family members, caretakers, and with consultants:1} {Document social determinants of health affecting pt's care:1} {Document your decision making why or why not admission, treatments were needed:1} Final Clinical Impression(s) / ED  Diagnoses Final diagnoses:  Pain of upper abdomen    Rx / DC Orders ED Discharge Orders     None

## 2023-06-05 NOTE — Assessment & Plan Note (Signed)
 Reports history of PTSD. -Resume nightly Xanax 

## 2023-06-05 NOTE — Assessment & Plan Note (Signed)
 Stable.  Not on medications.

## 2023-06-05 NOTE — ED Notes (Signed)
 Pt made aware of need for urine sample. Pt to allow pain medication to kick in then attempt to urinate

## 2023-06-05 NOTE — H&P (Addendum)
 History and Physical    Judith Hill:[id number] DOB: [birth year] DOA: 06/05/2023  PCP: Lazoff, Shawn P, DO   Patient coming from: Home  I have personally briefly reviewed patient's old medical records in [facility]  Chief Complaint: Abdominal pain, vomiting  HPI: Judith Hill is a 73 y.o. female with medical history significant for COPD, PTSD, CVA, hypertension. Patient presented to the ED with complaints of right upper quadrant abdominal pain nausea and 2-3 daily episodes of vomiting of 1 week duration.  She saw her outpatient provider and was diagnosed with gallstones on ultrasound-5/20 ( Per Care Everywhere) with plan to follow-up with general surgery.  No diarrhea.  She reports chills without fevers.  No urinary symptoms, no respiratory symptoms.  ED Course: Pressure 98.6.  Heart rate 69-87.  Respirate rate 16-18.  Blood pressure systolic 113-106.  O2 sats greater than 93% on room air.  Potassium 3.3.  WBC 9.6.  Lipase 25.  EDP talked to Dr. Collene Dawson, will see in consult tomorrow, possibly surgery on Monday, start IV antibiotics.  Review of Systems: As per HPI all other systems reviewed and negative.  Past Medical History:  Diagnosis Date   Adenomatous polyp of colon 2007   Anxiety    Arthritis    Blood transfusion without reported diagnosis 2009   after spinal fusion- pt states had issues with transfusion- ended up in ICU post transfusion x 9 days    Cataract    removed both eyes with lens implants    Constipation    dulcolax prn OTC-    COPD (chronic obstructive pulmonary disease) (HCC)    CVA (cerebral infarction)    small stroke confirmed on MRI   Depression    Hypothyroidism    Stroke [facility] [place])     Past Surgical History:  Procedure Laterality Date   CATARACT EXTRACTION W/PHACO Right 10/07/2017   Procedure: CATARACT EXTRACTION PHACO AND INTRAOCULAR LENS PLACEMENT (IOC);  Surgeon: Tarri Farm, MD;  Location: [facility];  Service: Ophthalmology;  Laterality:  Right;  CDE: 4.21   CATARACT EXTRACTION W/PHACO Left 10/29/2017   Procedure: CATARACT EXTRACTION PHACO AND INTRAOCULAR LENS PLACEMENT LEFT EYE CDE=4.77;  Surgeon: Tarri Farm, MD;  Location: [facility];  Service: Ophthalmology;  Laterality: Left;  left   COLONOSCOPY     fracture arm  1965   compund fracture right arm; 7 surgeries with bone graft and skin graft   POLYPECTOMY     SPINAL FUSION  2009   TONSILLECTOMY       reports that she has been smoking cigarettes. She has a 22.5 pack-year smoking history. She has never used smokeless tobacco. She reports current alcohol use. She reports that she does not use drugs.  Allergies  Allergen Reactions   Penicillins Rash and Other (See Comments)    Has patient had a PCN reaction causing immediate rash, facial/tongue/throat swelling, SOB or lightheadedness with hypotension: No Has patient had a PCN reaction causing severe rash involving mucus membranes or skin necrosis: No Has patient had a PCN reaction that required hospitalization: No Has patient had a PCN reaction occurring within the last 10 years: No If all of the above answers are "NO", then may proceed with Cephalosporin use.     Family History  Problem Relation Age of Onset   Colon cancer Paternal Grandmother 71   Esophageal cancer Maternal Grandfather    Stomach cancer Neg Hx    Rectal cancer Neg Hx    Pancreatic cancer Neg Hx  Colon polyps Neg Hx     Prior to Admission medications   Medication Sig Start Date End Date Taking? Authorizing Provider  ALPRAZolam  (XANAX ) 1 MG tablet Take 0.5-1 mg by mouth 3 (three) times daily as needed for anxiety.  11/06/11   [provider]  aspirin  (ASPIRIN  81) 81 MG EC tablet Take 1 tablet (81 mg total) by mouth daily. Swallow whole. 02/12/20   Demaris Fillers, MD  atorvastatin  (LIPITOR) 20 MG tablet Take 1 tablet (20 mg total) by mouth daily. 02/12/20   Demaris Fillers, MD  buPROPion  (WELLBUTRIN  XL) 300 MG 24 hr tablet Take 300 mg by mouth  daily.    [provider]  Coenzyme Q10 (COQ10) 100 MG CAPS Take 100 mg by mouth daily.    [provider]  EUTHYROX  50 MCG tablet Take 50 mcg by mouth daily. 01/07/20   [provider]  Fluticasone-Salmeterol (ADVAIR) 250-50 MCG/DOSE AEPB Inhale 1 puff into the lungs daily. 03/28/18   [provider]  HYDROcodone -acetaminophen  (NORCO) 10-325 MG tablet Take 1 tablet by mouth 3 (three) times daily as needed. for pain 02/06/20   [provider]  linaclotide (LINZESS) 72 MCG capsule Take 72 mcg by mouth daily before breakfast. Prn    [provider]  meclizine  (ANTIVERT ) 25 MG tablet Take 25 mg by mouth 2 (two) times daily as needed for dizziness.  10/10/12   Zackowski, Scott, MD  Multiple Vitamins-Minerals (HEALTHY EYES/LUTEIN) TABS Take 1 tablet by mouth daily.    [provider]  Multiple Vitamins-Minerals (MULTIVITAMINS THER. W/MINERALS) TABS tablet Take 1 tablet by mouth daily.    [provider]  SPIRIVA RESPIMAT 1.25 MCG/ACT AERS Inhale 2 puffs into the lungs daily. 02/02/20   [provider]  vitamin C (ASCORBIC ACID ) 500 MG tablet Take 500 mg by mouth daily.    [provider]  vitamin E  200 UNIT capsule Take 330 Units by mouth daily.     [provider]    Physical Exam: Vitals:   06/05/23 1750 06/05/23 1751 06/05/23 1945 06/05/23 2030  BP:  (!) 136/93 113/64 127/76  Pulse:  87 69 78  Resp:  16  18  Temp:  98.3 F (36.8 C)    TempSrc:  Oral    SpO2:  97% 93% 97%  Weight: 99.3 kg     Height: 5\' 6"  (1.676 m)       Constitutional: NAD, calm, comfortable Vitals:   06/05/23 1750 06/05/23 1751 06/05/23 1945 06/05/23 2030  BP:  (!) 136/93 113/64 127/76  Pulse:  87 69 78  Resp:  16  18  Temp:  98.3 F (36.8 C)    TempSrc:  Oral    SpO2:  97% 93% 97%  Weight: 99.3 kg     Height: 5\' 6"  (1.676 m)      Eyes: PERRL, lids and conjunctivae normal ENMT: Mucous membranes are moist.  Neck:  normal, supple, no masses, no thyromegaly Respiratory: clear to auscultation bilaterally, no wheezing, no crackles. Normal respiratory effort. No accessory muscle use.  Cardiovascular: Regular rate and rhythm, no murmurs / rubs / gallops. No extremity edema.  Extremities warm. Abdomen: Mild right upper quadrant/epigastric tenderness on deep palpation, no masses palpated. No hepatosplenomegaly.   Musculoskeletal: no clubbing / cyanosis. No joint deformity upper and lower extremities.  Skin: no rashes, lesions, ulcers. No induration Neurologic: No facial asymmetry, moving extremities spontaneously, speech fluent.  Psychiatric: Normal judgment and insight. Alert and oriented x 3. Normal mood.  Labs on Admission: I have personally reviewed following labs and imaging studies  CBC: Recent Labs  Lab 06/05/23 1807  WBC 9.6  NEUTROABS 6.2  HGB 14.5  HCT 43.9  MCV 96.7  PLT 267   Basic Metabolic Panel: Recent Labs  Lab 06/05/23 1807  NA 137  K 3.3*  CL 101  CO2 31  GLUCOSE 95  BUN 27*  CREATININE 1.16*  CALCIUM  9.3   GFR: Estimated Creatinine Clearance: 53.6 mL/min (A) (by C-G formula based on SCr of 1.16 mg/dL (H)). Liver Function Tests: Recent Labs  Lab 06/05/23 1807  AST 15  ALT 15  ALKPHOS 84  BILITOT 0.4  PROT 6.7  ALBUMIN 3.7   Recent Labs  Lab 06/05/23 1807  LIPASE 25   Urine analysis:    Component Value Date/Time   COLORURINE YELLOW 06/05/2023 2027   APPEARANCEUR HAZY (A) 06/05/2023 2027   LABSPEC 1.034 (H) 06/05/2023 2027   PHURINE 6.0 06/05/2023 2027   GLUCOSEU NEGATIVE 06/05/2023 2027   HGBUR NEGATIVE 06/05/2023 2027   BILIRUBINUR NEGATIVE 06/05/2023 2027   KETONESUR NEGATIVE 06/05/2023 2027   PROTEINUR NEGATIVE 06/05/2023 2027   UROBILINOGEN 0.2 05/27/2008 1105   NITRITE NEGATIVE 06/05/2023 2027   LEUKOCYTESUR NEGATIVE 06/05/2023 2027    Radiological Exams on Admission: CT ABDOMEN PELVIS W CONTRAST Result Date: 06/05/2023 CLINICAL DATA:   Abdominal pain with nausea and vomiting. EXAM: CT ABDOMEN AND PELVIS WITH CONTRAST TECHNIQUE: Multidetector CT imaging of the abdomen and pelvis was performed using the standard protocol following bolus administration of intravenous contrast. RADIATION DOSE REDUCTION: This exam was performed according to the departmental dose-optimization program which includes automated exposure control, adjustment of the mA and/or kV according to patient size and/or use of iterative reconstruction technique. CONTRAST:  OMNIPAQUE  IOHEXOL  300 MG/ML  SOLN COMPARISON:  None Available. FINDINGS: Lower chest: No acute abnormality. Hepatobiliary: No focal liver abnormality is seen. Numerous ill-defined gallstones are seen within an otherwise normal-appearing gallbladder. There is no evidence of gallbladder wall thickening, pericholecystic inflammation or biliary dilatation. Pancreas: Unremarkable. No pancreatic ductal dilatation or surrounding inflammatory changes. Spleen: Normal in size without focal abnormality. Adrenals/Urinary Tract: Adrenal glands are unremarkable. Kidneys are normal, without renal calculi, focal lesion, or hydronephrosis. Bladder is unremarkable. Stomach/Bowel: Stomach is within normal limits. Appendix appears normal. Stool is seen throughout the large bowel. No evidence of bowel wall thickening, distention, or inflammatory changes. Noninflamed diverticula are seen throughout the sigmoid colon. Vascular/Lymphatic: Aortic atherosclerosis. No enlarged abdominal or pelvic lymph nodes. Reproductive: Uterus and bilateral adnexa are unremarkable. Other: No abdominal wall hernia or abnormality. No abdominopelvic ascites. Musculoskeletal: Postoperative changes are seen within the lower lumbar spine. Marked severity multilevel degenerative changes are also present. IMPRESSION: 1. Cholelithiasis. 2. Sigmoid diverticulosis. 3. Postoperative changes within the lower lumbar spine. 4. Aortic atherosclerosis. Electronically  Signed   By: Virgle Grime M.D.   On: 06/05/2023 20:37   EKG: None.   Assessment/Plan Principal Problem:   Cholelithiases Active Problems:   Asthma   COPD   CVA (cerebral vascular accident) (HCC)   Adjustment reaction with anxiety and depression   Essential hypertension  Assessment and Plan: * Cholelithiases Presenting with abdominal pain nausea vomiting over the past week.  CT abdomen and pelvis with contrast-cholelithiasis.  Afebrile without leukocytosis.  Normal liver enzymes, lipase normal 25. -EDP talked to Dr. Collene Dawson, will see in consult tomorrow, IV antibiotics - Clear Liquid diet -IV Dilaudid  0.5 every 4 hourly as needed -IV ceftriaxone 2 g daily, IV  metronidazole - 500 mL bolus given, continue N/s+ 40kcl 75cc/hr x 15hrs  Essential hypertension Stable.  Not on medications.  Adjustment reaction with anxiety and depression Reports history of PTSD. -Resume nightly Xanax   CVA (cerebral vascular accident) (HCC) On aspirin  and statins -Resume  Asthma History of asthma COPD.  Stable.   - Resume home bronchodilator regimen.  Hypokalemia potassium 3.3.  DVT prophylaxis: Lovenox  Code Status: FULL code Family Communication: None at bedside Disposition Plan: ~ 2 days Consults called: Gen Surg Admission status: Inpt Stepdown I certify that at the point of admission it is my clinical judgment that the patient will require inpatient hospital care spanning beyond 2 midnights from the point of admission due to high intensity of service, high risk for further deterioration and high frequency of surveillance required.   Author: Pati Bonine, MD 06/05/2023 10:09 PM  For on call review [email or web address].

## 2023-06-06 DIAGNOSIS — K8020 Calculus of gallbladder without cholecystitis without obstruction: Secondary | ICD-10-CM | POA: Diagnosis not present

## 2023-06-06 DIAGNOSIS — K8010 Calculus of gallbladder with chronic cholecystitis without obstruction: Secondary | ICD-10-CM

## 2023-06-06 LAB — CBC
HCT: 42.6 % (ref 36.0–46.0)
Hemoglobin: 13.6 g/dL (ref 12.0–15.0)
MCH: 31.2 pg (ref 26.0–34.0)
MCHC: 31.9 g/dL (ref 30.0–36.0)
MCV: 97.7 fL (ref 80.0–100.0)
Platelets: 231 10*3/uL (ref 150–400)
RBC: 4.36 MIL/uL (ref 3.87–5.11)
RDW: 13.2 % (ref 11.5–15.5)
WBC: 10.1 10*3/uL (ref 4.0–10.5)
nRBC: 0 % (ref 0.0–0.2)

## 2023-06-06 LAB — BASIC METABOLIC PANEL WITH GFR
Anion gap: 4 — ABNORMAL LOW (ref 5–15)
BUN: 19 mg/dL (ref 8–23)
CO2: 28 mmol/L (ref 22–32)
Calcium: 8.5 mg/dL — ABNORMAL LOW (ref 8.9–10.3)
Chloride: 105 mmol/L (ref 98–111)
Creatinine, Ser: 1.03 mg/dL — ABNORMAL HIGH (ref 0.44–1.00)
GFR, Estimated: 58 mL/min — ABNORMAL LOW (ref >60)
Glucose, Bld: 89 mg/dL (ref 70–99)
Potassium: 3.8 mmol/L (ref 3.5–5.1)
Sodium: 137 mmol/L (ref 135–145)

## 2023-06-06 LAB — SURGICAL PCR SCREEN
MRSA, PCR: NEGATIVE
Staphylococcus aureus: NEGATIVE

## 2023-06-06 MED ORDER — DEXTROSE-SODIUM CHLORIDE 5-0.45 % IV SOLN: INTRAVENOUS | Status: AC

## 2023-06-06 MED ORDER — CHLORHEXIDINE GLUCONATE CLOTH 2 % EX PADS
6.0000 | MEDICATED_PAD | Freq: Once | CUTANEOUS | Status: AC
  Administered 2023-06-06: 6 via TOPICAL

## 2023-06-06 MED ORDER — ACETAMINOPHEN 650 MG RE SUPP
650.0000 mg | Freq: Four times a day (QID) | RECTAL | Status: DC | PRN
Start: 2023-06-06 — End: 2023-06-08

## 2023-06-06 MED ORDER — ACETAMINOPHEN 325 MG PO TABS
650.0000 mg | ORAL_TABLET | Freq: Four times a day (QID) | ORAL | Status: DC | PRN
  Administered 2023-06-06 – 2023-06-07 (×2): 650 mg via ORAL
  Filled 2023-06-06 (×2): qty 2

## 2023-06-06 MED ORDER — INDOCYANINE GREEN 25 MG IV SOLR
2.5000 mg | Freq: Once | INTRAVENOUS | Status: DC
  Filled 2023-06-06: qty 10

## 2023-06-06 MED ORDER — SODIUM CHLORIDE 0.9 % IV SOLN
2.0000 g | INTRAVENOUS | Status: DC
  Filled 2023-06-06: qty 2

## 2023-06-06 NOTE — Plan of Care (Signed)
   Problem: Education: Goal: Knowledge of General Education information will improve Description: Including pain rating scale, medication(s)/side effects and non-pharmacologic comfort measures Outcome: Progressing   Problem: Health Behavior/Discharge Planning: Goal: Ability to manage health-related needs will improve Outcome: Progressing   Problem: Clinical Measurements: Goal: Diagnostic test results will improve Outcome: Progressing

## 2023-06-06 NOTE — Progress Notes (Signed)
 Error. See separate encounter for documentation.

## 2023-06-06 NOTE — Progress Notes (Signed)
 [facility] PROGRESS NOTE  Judith Hill (DOB: 01-20-1950) ZOX:[id number] PCP: Orlena Bitters, DO  Brief Narrative: Judith Hill is a 73 y.o. female with a history of COPD, CVA, HTN, PTSD, and cholelithiasis diagnosed by U/S 5/20 who presented to the ED on 06/05/2023 with worsening RUQ abdominal pain. She was admitted for symptomatic cholelithiasis with surgery consult pending.  Subjective: Wished her a happy birthday. She's feeling stable severe RUQ predominant pain but also radiating upper shoulder pain and headache she attributes to not sleeping/tensing up. No current or recent chest pain or dyspnea on exertion. No vomiting.   Objective: BP 102/65 (BP Location: Right Arm)   Pulse 79   Temp 98.2 F (36.8 C) (Oral)   Resp 18   Ht 5\' 6"  (1.676 m)   Wt 100.5 kg   SpO2 96%   BMI 35.75 kg/m   Gen: No distress Pulm: Clear, nonlabored  CV: RRR, no MRG GI: Soft, +tenderness without rebound in RUQ/epigastric regions, nondistended. Obese.  Neuro: Alert and oriented. No new focal deficits. Ext: Warm, no deformities. Skin: No rashes, lesions or ulcers on visualized skin   Assessment & Plan: Symptomatic cholelithiasis: No obstruction noted on labs or CT.  - Surgery consult pending - Keeping on CLD, started ceftriaxone/flagyl.  - Continue IVF and analgesia as ordered.   Headache: Suspected to be tension-related.  - Continue prn tylenol   History of CVA:  - Continue aspirin , statin  Asthma/COPD: Quiescent.  - Continue home controller meds and prn albuterol   Hypokalemia: Resolved with supplementation, will monitor.   PTSD, anxiety:  - Continue bupropion , qHS alprazolam   HTN: Not on medications, BP normal.   IBS:  - Continue linzess, prn miralax  Class II obesity: Body mass index is 35.75 kg/m.   Wynetta Heckle, MD [facility] [email or web address] 06/06/2023, 10:30 AM

## 2023-06-06 NOTE — H&P (View-Only) (Signed)
 [facility] Consult  Reason for Consult: Gallstones, intractable nausea/vomiting, concern for cholecystitis  Referring Physician: Dr. Bryna Car  Chief Complaint   Abdominal Pain     HPI: Judith Hill is a 73 y.o. female with RUQ pain for over 3 weeks that is getting worse and is associated with nausea and vomiting. She had a US  that demonstrated stones but no cholecystitis at that time at [facility]. She has seen GI a few times and had worsening pain prompting the ED visit. The pain is associated with food at times but not always. She is being evaluated for future colonoscopy and EGD per GI after her cholecystectomy. She has constipation at baseline due to narcotic use as she is on pain contract and takes hydrocodone  for back pain.   Past Medical History:  Diagnosis Date   Adenomatous polyp of colon 2007   Anxiety    Arthritis    Blood transfusion without reported diagnosis 2009   after spinal fusion- pt states had issues with transfusion- ended up in ICU post transfusion x 9 days    Cataract    removed both eyes with lens implants    Constipation    dulcolax prn OTC-    COPD (chronic obstructive pulmonary disease) (HCC)    CVA (cerebral infarction)    small stroke confirmed on MRI   Depression    Hypothyroidism    Stroke [facility])     Past Surgical History:  Procedure Laterality Date   CATARACT EXTRACTION W/PHACO Right 10/07/2017   Procedure: CATARACT EXTRACTION PHACO AND INTRAOCULAR LENS PLACEMENT (IOC);  Surgeon: Tarri Farm, MD;  Location: [facility];  Service: Ophthalmology;  Laterality: Right;  CDE: 4.21   CATARACT EXTRACTION W/PHACO Left 10/29/2017   Procedure: CATARACT EXTRACTION PHACO AND INTRAOCULAR LENS PLACEMENT LEFT EYE CDE=4.77;  Surgeon: Tarri Farm, MD;  Location: [facility];  Service: Ophthalmology;  Laterality: Left;  left   COLONOSCOPY     fracture arm  1965   compund fracture right arm; 7 surgeries with bone graft and skin graft   POLYPECTOMY      SPINAL FUSION  2009   TONSILLECTOMY      Family History  Problem Relation Age of Onset   Colon cancer Paternal Grandmother 57   Esophageal cancer Maternal Grandfather    Stomach cancer Neg Hx    Rectal cancer Neg Hx    Pancreatic cancer Neg Hx    Colon polyps Neg Hx     Social History   Tobacco Use   Smoking status: Every Day    Current packs/day: 0.50    Average packs/day: 0.5 packs/day for 45.0 years (22.5 ttl pk-yrs)    Types: Cigarettes   Smokeless tobacco: Never  Vaping Use   Vaping status: Never Used  Substance Use Topics   Alcohol use: Yes    Comment: occasionally   Drug use: No    Medications: I have reviewed the patient's current medications. Prior to Admission:  Medications Prior to Admission  Medication Sig Dispense Refill Last Dose/Taking   ALPRAZolam  (XANAX ) 1 MG tablet Take 0.5-1 mg by mouth 3 (three) times daily as needed for anxiety.       aspirin  (ASPIRIN  81) 81 MG EC tablet Take 1 tablet (81 mg total) by mouth daily. Swallow whole. 30 tablet 12    atorvastatin  (LIPITOR) 20 MG tablet Take 1 tablet (20 mg total) by mouth daily. 30 tablet 1    buPROPion  (WELLBUTRIN  XL) 300 MG 24 hr tablet Take 300  mg by mouth daily.      Coenzyme Q10 (COQ10) 100 MG CAPS Take 100 mg by mouth daily.      EUTHYROX  50 MCG tablet Take 50 mcg by mouth daily.      Fluticasone-Salmeterol (ADVAIR) 250-50 MCG/DOSE AEPB Inhale 1 puff into the lungs daily.      HYDROcodone -acetaminophen  (NORCO) 10-325 MG tablet Take 1 tablet by mouth 3 (three) times daily as needed. for pain      linaclotide (LINZESS) 72 MCG capsule Take 72 mcg by mouth daily before breakfast. Prn      meclizine  (ANTIVERT ) 25 MG tablet Take 25 mg by mouth 2 (two) times daily as needed for dizziness.       Multiple Vitamins-Minerals (HEALTHY EYES/LUTEIN) TABS Take 1 tablet by mouth daily.      Multiple Vitamins-Minerals (MULTIVITAMINS THER. W/MINERALS) TABS tablet Take 1 tablet by mouth daily.      SPIRIVA  RESPIMAT 1.25 MCG/ACT AERS Inhale 2 puffs into the lungs daily.      vitamin C (ASCORBIC ACID ) 500 MG tablet Take 500 mg by mouth daily.      vitamin E  200 UNIT capsule Take 330 Units by mouth daily.       Scheduled:  ALPRAZolam   0.5-1 mg Oral QHS   aspirin  EC  81 mg Oral Daily   atorvastatin   20 mg Oral Daily   buPROPion   300 mg Oral Daily   Chlorhexidine Gluconate Cloth  6 each Topical Once   And   Chlorhexidine Gluconate Cloth  6 each Topical Once   enoxaparin  (LOVENOX ) injection  40 mg Subcutaneous Q24H   fluticasone furoate-vilanterol  1 puff Inhalation Daily   [START ON 06/07/2023] indocyanine green  2.5 mg Intravenous Once   linaclotide  72 mcg Oral QAC breakfast   umeclidinium bromide  1 puff Inhalation Daily   Continuous:  [START ON 06/07/2023] cefoTEtan (CEFOTAN) IV     cefTRIAXone (ROCEPHIN)  IV     dextrose 5 % and 0.45 % NaCl     metronidazole 500 mg (06/05/23 2256)   PRN:acetaminophen  **OR** acetaminophen , HYDROmorphone  (DILAUDID ) injection, ondansetron  **OR** ondansetron  (ZOFRAN ) IV, polyethylene glycol  Allergies  Allergen Reactions   Penicillins Rash and Other (See Comments)    Has patient had a PCN reaction causing immediate rash, facial/tongue/throat swelling, SOB or lightheadedness with hypotension: No Has patient had a PCN reaction causing severe rash involving mucus membranes or skin necrosis: No Has patient had a PCN reaction that required hospitalization: No Has patient had a PCN reaction occurring within the last 10 years: No If all of the above answers are "NO", then may proceed with Cephalosporin use.      ROS:  A comprehensive review of systems was negative except for: Gastrointestinal: positive for abdominal pain, nausea, reflux symptoms, and vomiting  Blood pressure (!) 103/58, pulse 84, temperature 98.2 F (36.8 C), temperature source Oral, resp. rate 18, height 5\' 6"  (1.676 m), weight 100.5 kg, SpO2 95%. Physical Exam Vitals reviewed.  HENT:      Head: Normocephalic.  Cardiovascular:     Rate and Rhythm: Normal rate.  Pulmonary:     Effort: Pulmonary effort is normal.  Abdominal:     General: There is no distension.     Palpations: Abdomen is soft.     Tenderness: There is abdominal tenderness in the right upper quadrant and epigastric area.  Musculoskeletal:     Comments: Moves all extremities   Skin:    General: Skin is warm.  Neurological:     General: No focal deficit present.     Mental Status: She is alert and oriented to person, place, and time.  Psychiatric:        Mood and Affect: Mood normal.        Behavior: Behavior normal.     Results: Results for orders placed or performed during the hospital encounter of 06/05/23 (from the past 48 hours)  Lipase, blood     Status: None   Collection Time: 06/05/23  6:07 PM  Result Value Ref Range   Lipase 25 11 - 51 U/L    Comment: Performed at [facility], [address] [place], [place] [address]  Comprehensive metabolic panel     Status: Abnormal   Collection Time: 06/05/23  6:07 PM  Result Value Ref Range   Sodium 137 135 - 145 mmol/L   Potassium 3.3 (L) 3.5 - 5.1 mmol/L   Chloride 101 98 - 111 mmol/L   CO2 31 22 - 32 mmol/L   Glucose, Bld 95 70 - 99 mg/dL    Comment: Glucose reference range applies only to samples taken after fasting for at least 8 hours.   BUN 27 (H) 8 - 23 mg/dL   Creatinine, Ser 4.78 (H) 0.44 - 1.00 mg/dL   Calcium  9.3 8.9 - 10.3 mg/dL   Total Protein 6.7 6.5 - 8.1 g/dL   Albumin 3.7 3.5 - 5.0 g/dL   AST 15 15 - 41 U/L   ALT 15 0 - 44 U/L   Alkaline Phosphatase 84 38 - 126 U/L   Total Bilirubin 0.4 0.0 - 1.2 mg/dL   GFR, Estimated 51 (L) >60 mL/min    Comment: (NOTE) Calculated using the CKD-EPI Creatinine Equation (2021)    Anion gap 5 5 - 15    Comment: Performed at [facility], [address], [place], [place] [address]  CBC     Status: None   Collection Time: 06/05/23  6:07 PM  Result Value Ref Range   WBC 9.6 4.0 - 10.5  K/uL   RBC 4.54 3.87 - 5.11 MIL/uL   Hemoglobin 14.5 12.0 - 15.0 g/dL   HCT 13.0 86.5 - 78.4 %   MCV 96.7 80.0 - 100.0 fL   MCH 31.9 26.0 - 34.0 pg   MCHC 33.0 30.0 - 36.0 g/dL   RDW 69.6 29.5 - 28.4 %   Platelets 267 150 - 400 K/uL   nRBC 0.0 0.0 - 0.2 %    Comment: Performed at [facility], [address], [place], [place] [address]  Differential     Status: None   Collection Time: 06/05/23  6:07 PM  Result Value Ref Range   Neutrophils Relative % 63 %   Neutro Abs 6.2 1.7 - 7.7 K/uL   Lymphocytes Relative 25 %   Lymphs Abs 2.4 0.7 - 4.0 K/uL   Monocytes Relative 9 %   Monocytes Absolute 0.9 0.1 - 1.0 K/uL   Eosinophils Relative 1 %   Eosinophils Absolute 0.1 0.0 - 0.5 K/uL   Basophils Relative 1 %   Basophils Absolute 0.1 0.0 - 0.1 K/uL   Immature Granulocytes 1 %   Abs Immature Granulocytes 0.05 0.00 - 0.07 K/uL    Comment: Performed at [facility], [address] [place], [place] [address]  Urinalysis, Routine w reflex microscopic -Urine, Clean Catch     Status: Abnormal   Collection Time: 06/05/23  8:27 PM  Result Value Ref Range   Color, Urine  YELLOW YELLOW   APPearance HAZY (A) CLEAR   Specific Gravity, Urine 1.034 (H) 1.005 - 1.030   pH 6.0 5.0 - 8.0   Glucose, UA NEGATIVE NEGATIVE mg/dL   Hgb urine dipstick NEGATIVE NEGATIVE   Bilirubin Urine NEGATIVE NEGATIVE   Ketones, ur NEGATIVE NEGATIVE mg/dL   Protein, ur NEGATIVE NEGATIVE mg/dL   Nitrite NEGATIVE NEGATIVE   Leukocytes,Ua NEGATIVE NEGATIVE    Comment: Performed at [facility], [address] [place], [place] [address]  Basic metabolic panel     Status: Abnormal   Collection Time: 06/06/23  5:52 AM  Result Value Ref Range   Sodium 137 135 - 145 mmol/L   Potassium 3.8 3.5 - 5.1 mmol/L   Chloride 105 98 - 111 mmol/L   CO2 28 22 - 32 mmol/L   Glucose, Bld 89 70 - 99 mg/dL    Comment: Glucose reference range applies only to samples taken after fasting for at least 8 hours.   BUN 19 8 - 23 mg/dL    Creatinine, Ser 6.57 (H) 0.44 - 1.00 mg/dL   Calcium  8.5 (L) 8.9 - 10.3 mg/dL   GFR, Estimated 58 (L) >60 mL/min    Comment: (NOTE) Calculated using the CKD-EPI Creatinine Equation (2021)    Anion gap 4 (L) 5 - 15    Comment: Performed at [facility], [address] [place], [place] [address]  CBC     Status: None   Collection Time: 06/06/23  5:52 AM  Result Value Ref Range   WBC 10.1 4.0 - 10.5 K/uL   RBC 4.36 3.87 - 5.11 MIL/uL   Hemoglobin 13.6 12.0 - 15.0 g/dL   HCT 29.5 28.4 - 13.2 %   MCV 97.7 80.0 - 100.0 fL   MCH 31.2 26.0 - 34.0 pg   MCHC 31.9 30.0 - 36.0 g/dL   RDW 44.0 10.2 - 72.5 %   Platelets 231 150 - 400 K/uL   nRBC 0.0 0.0 - 0.2 %    Comment: Performed at [facility], [address] [place], [place] [address]   Personally reviewed the CT and showed patient- large stones in the gallbladder, to me the wall looks a little thickened at the dome  CT ABDOMEN PELVIS W CONTRAST Result Date: 06/05/2023 CLINICAL DATA:  Abdominal pain with nausea and vomiting. EXAM: CT ABDOMEN AND PELVIS WITH CONTRAST TECHNIQUE: Multidetector CT imaging of the abdomen and pelvis was performed using the standard protocol following bolus administration of intravenous contrast. RADIATION DOSE REDUCTION: This exam was performed according to the departmental dose-optimization program which includes automated exposure control, adjustment of the mA and/or kV according to patient size and/or use of iterative reconstruction technique. CONTRAST:  100mL OMNIPAQUE  IOHEXOL  300 MG/ML  SOLN COMPARISON:  None Available. FINDINGS: Lower chest: No acute abnormality. Hepatobiliary: No focal liver abnormality is seen. Numerous ill-defined gallstones are seen within an otherwise normal-appearing gallbladder. There is no evidence of gallbladder wall thickening, pericholecystic inflammation or biliary dilatation. Pancreas: Unremarkable. No pancreatic ductal dilatation or surrounding inflammatory changes. Spleen: Normal in  size without focal abnormality. Adrenals/Urinary Tract: Adrenal glands are unremarkable. Kidneys are normal, without renal calculi, focal lesion, or hydronephrosis. Bladder is unremarkable. Stomach/Bowel: Stomach is within normal limits. Appendix appears normal. Stool is seen throughout the large bowel. No evidence of bowel wall thickening, distention, or inflammatory changes. Noninflamed diverticula are seen throughout the sigmoid colon. Vascular/Lymphatic: Aortic atherosclerosis. No enlarged abdominal or pelvic lymph nodes. Reproductive: Uterus and bilateral adnexa are unremarkable. Other: No  abdominal wall hernia or abnormality. No abdominopelvic ascites. Musculoskeletal: Postoperative changes are seen within the lower lumbar spine. Marked severity multilevel degenerative changes are also present. IMPRESSION: 1. Cholelithiasis. 2. Sigmoid diverticulosis. 3. Postoperative changes within the lower lumbar spine. 4. Aortic atherosclerosis. Electronically Signed   By: Virgle Grime M.D.   On: 06/05/2023 20:37   Care Everywhere:  INDICATION: Right upper quadrant pain \ R10.11 Right upper quadrant pain  COMPARISON: None.   TECHNIQUE: Multi-planar, real-time ultrasonography of the abdomen using grayscale imaging was performed, supplemented by color and/or power Doppler as needed.   FINDINGS:   . IVC: Visualized portions are patent.  .  Aorta: Visualized portions are unremarkable.  .  Pancreas: Visualized portions are unremarkable.  .  Liver: Length = 14.3 cm. Normal echogenicity and echotexture. Query minimally nodular contour. No focal lesions. Visualized portions of the portal and hepatic venous systems are patent with antegrade flow.  .  Gallbladder: Cholelithiasis. No gallbladder wall thickening or pericholecystic fluid. Negative sonographic Murphy's sign.  .  Biliary: CBD maximum caliber = 3-4 mm. No intrahepatic ductal dilatation.  .  Right Kidney: Length = 10.9 cm. Normal contour and  echogenicity. No hydronephrosis or perinephric fluid. No focal mass is identified.  .  Peritoneum: No ascites.   Assessment & Plan:  Judith Hill is a 73 y.o. female with gallstones but worsening pain and symptoms after 3 weeks of this starting. She came to the ED. Due to her tenderness, I had them treat her like she was developing cholecystitis. She started antibiotics. If her issues continue, she is already planning for EGD but given the tenderness I think this is her gallbladder and she could be developing some cholecystitis.   PLAN: I counseled the patient about the indication, risks and benefits of robotic assisted laparoscopic cholecystectomy.  She understands there is a very small chance for bleeding, infection, injury to normal structures (including common bile duct), conversion to open surgery, persistent symptoms, evolution of postcholecystectomy diarrhea, need for secondary interventions, anesthesia reaction, cardiopulmonary issues and other risks not specifically detailed here. I described the expected recovery, the plan for follow-up and the restrictions during the recovery phase.  All questions were answered.   Dr. Larrie Po will do surgery tomorrow 8AM. NPO midnight Orders placed  Updated team.   Awilda Bogus 06/06/2023, 8:41 AM

## 2023-06-06 NOTE — Progress Notes (Signed)
   06/06/23 1159  TOC Brief Assessment  Insurance and Status Reviewed  Patient has primary care physician Yes  Home environment has been reviewed Apartment  Prior level of function: Independent  Prior/Current Home Services No current home services  Social Drivers of Health Review SDOH reviewed no interventions necessary  Readmission risk has been reviewed Yes  Transition of care needs no transition of care needs at this time   Transition of Care Department [facility]) has reviewed patient, and no TOC needs have been identified at this time. We will continue to monitor patient advancement through interdisciplinary progressions rounds. If new patient transition needs arise, please place a TOC consult.

## 2023-06-06 NOTE — Consult Note (Signed)
 [facility] Consult  Reason for Consult: Gallstones, intractable nausea/vomiting, concern for cholecystitis  Referring Physician: Dr. Bryna Car  Chief Complaint   Abdominal Pain     HPI: Judith Hill is a 73 y.o. female with RUQ pain for over 3 weeks that is getting worse and is associated with nausea and vomiting. She had a US  that demonstrated stones but no cholecystitis at that time at [facility]. She has seen GI a few times and had worsening pain prompting the ED visit. The pain is associated with food at times but not always. She is being evaluated for future colonoscopy and EGD per GI after her cholecystectomy. She has constipation at baseline due to narcotic use as she is on pain contract and takes hydrocodone  for back pain.   Past Medical History:  Diagnosis Date   Adenomatous polyp of colon 2007   Anxiety    Arthritis    Blood transfusion without reported diagnosis 2009   after spinal fusion- pt states had issues with transfusion- ended up in ICU post transfusion x 9 days    Cataract    removed both eyes with lens implants    Constipation    dulcolax prn OTC-    COPD (chronic obstructive pulmonary disease) (HCC)    CVA (cerebral infarction)    small stroke confirmed on MRI   Depression    Hypothyroidism    Stroke [facility])     Past Surgical History:  Procedure Laterality Date   CATARACT EXTRACTION W/PHACO Right 10/07/2017   Procedure: CATARACT EXTRACTION PHACO AND INTRAOCULAR LENS PLACEMENT (IOC);  Surgeon: Tarri Farm, MD;  Location: [facility];  Service: Ophthalmology;  Laterality: Right;  CDE: 4.21   CATARACT EXTRACTION W/PHACO Left 10/29/2017   Procedure: CATARACT EXTRACTION PHACO AND INTRAOCULAR LENS PLACEMENT LEFT EYE CDE=4.77;  Surgeon: Tarri Farm, MD;  Location: [facility];  Service: Ophthalmology;  Laterality: Left;  left   COLONOSCOPY     fracture arm  1965   compund fracture right arm; 7 surgeries with bone graft and skin graft   POLYPECTOMY      SPINAL FUSION  2009   TONSILLECTOMY      Family History  Problem Relation Age of Onset   Colon cancer Paternal Grandmother 57   Esophageal cancer Maternal Grandfather    Stomach cancer Neg Hx    Rectal cancer Neg Hx    Pancreatic cancer Neg Hx    Colon polyps Neg Hx     Social History   Tobacco Use   Smoking status: Every Day    Current packs/day: 0.50    Average packs/day: 0.5 packs/day for 45.0 years (22.5 ttl pk-yrs)    Types: Cigarettes   Smokeless tobacco: Never  Vaping Use   Vaping status: Never Used  Substance Use Topics   Alcohol use: Yes    Comment: occasionally   Drug use: No    Medications: I have reviewed the patient's current medications. Prior to Admission:  Medications Prior to Admission  Medication Sig Dispense Refill Last Dose/Taking   ALPRAZolam  (XANAX ) 1 MG tablet Take 0.5-1 mg by mouth 3 (three) times daily as needed for anxiety.       aspirin  (ASPIRIN  81) 81 MG EC tablet Take 1 tablet (81 mg total) by mouth daily. Swallow whole. 30 tablet 12    atorvastatin  (LIPITOR) 20 MG tablet Take 1 tablet (20 mg total) by mouth daily. 30 tablet 1    buPROPion  (WELLBUTRIN  XL) 300 MG 24 hr tablet Take 300  mg by mouth daily.      Coenzyme Q10 (COQ10) 100 MG CAPS Take 100 mg by mouth daily.      EUTHYROX  50 MCG tablet Take 50 mcg by mouth daily.      Fluticasone-Salmeterol (ADVAIR) 250-50 MCG/DOSE AEPB Inhale 1 puff into the lungs daily.      HYDROcodone -acetaminophen  (NORCO) 10-325 MG tablet Take 1 tablet by mouth 3 (three) times daily as needed. for pain      linaclotide (LINZESS) 72 MCG capsule Take 72 mcg by mouth daily before breakfast. Prn      meclizine  (ANTIVERT ) 25 MG tablet Take 25 mg by mouth 2 (two) times daily as needed for dizziness.       Multiple Vitamins-Minerals (HEALTHY EYES/LUTEIN) TABS Take 1 tablet by mouth daily.      Multiple Vitamins-Minerals (MULTIVITAMINS THER. W/MINERALS) TABS tablet Take 1 tablet by mouth daily.      SPIRIVA  RESPIMAT 1.25 MCG/ACT AERS Inhale 2 puffs into the lungs daily.      vitamin C (ASCORBIC ACID ) 500 MG tablet Take 500 mg by mouth daily.      vitamin E  200 UNIT capsule Take 330 Units by mouth daily.       Scheduled:  ALPRAZolam   0.5-1 mg Oral QHS   aspirin  EC  81 mg Oral Daily   atorvastatin   20 mg Oral Daily   buPROPion   300 mg Oral Daily   Chlorhexidine Gluconate Cloth  6 each Topical Once   And   Chlorhexidine Gluconate Cloth  6 each Topical Once   enoxaparin  (LOVENOX ) injection  40 mg Subcutaneous Q24H   fluticasone furoate-vilanterol  1 puff Inhalation Daily   [START ON 06/07/2023] indocyanine green  2.5 mg Intravenous Once   linaclotide  72 mcg Oral QAC breakfast   umeclidinium bromide  1 puff Inhalation Daily   Continuous:  [START ON 06/07/2023] cefoTEtan (CEFOTAN) IV     cefTRIAXone (ROCEPHIN)  IV     dextrose 5 % and 0.45 % NaCl     metronidazole 500 mg (06/05/23 2256)   PRN:acetaminophen  **OR** acetaminophen , HYDROmorphone  (DILAUDID ) injection, ondansetron  **OR** ondansetron  (ZOFRAN ) IV, polyethylene glycol  Allergies  Allergen Reactions   Penicillins Rash and Other (See Comments)    Has patient had a PCN reaction causing immediate rash, facial/tongue/throat swelling, SOB or lightheadedness with hypotension: No Has patient had a PCN reaction causing severe rash involving mucus membranes or skin necrosis: No Has patient had a PCN reaction that required hospitalization: No Has patient had a PCN reaction occurring within the last 10 years: No If all of the above answers are "NO", then may proceed with Cephalosporin use.      ROS:  A comprehensive review of systems was negative except for: Gastrointestinal: positive for abdominal pain, nausea, reflux symptoms, and vomiting  Blood pressure (!) 103/58, pulse 84, temperature 98.2 F (36.8 C), temperature source Oral, resp. rate 18, height 5\' 6"  (1.676 m), weight 100.5 kg, SpO2 95%. Physical Exam Vitals reviewed.  HENT:      Head: Normocephalic.  Cardiovascular:     Rate and Rhythm: Normal rate.  Pulmonary:     Effort: Pulmonary effort is normal.  Abdominal:     General: There is no distension.     Palpations: Abdomen is soft.     Tenderness: There is abdominal tenderness in the right upper quadrant and epigastric area.  Musculoskeletal:     Comments: Moves all extremities   Skin:    General: Skin is warm.  Neurological:     General: No focal deficit present.     Mental Status: She is alert and oriented to person, place, and time.  Psychiatric:        Mood and Affect: Mood normal.        Behavior: Behavior normal.     Results: Results for orders placed or performed during the hospital encounter of 06/05/23 (from the past 48 hours)  Lipase, blood     Status: None   Collection Time: 06/05/23  6:07 PM  Result Value Ref Range   Lipase 25 11 - 51 U/L    Comment: Performed at [facility], [address] [place], [place] [address]  Comprehensive metabolic panel     Status: Abnormal   Collection Time: 06/05/23  6:07 PM  Result Value Ref Range   Sodium 137 135 - 145 mmol/L   Potassium 3.3 (L) 3.5 - 5.1 mmol/L   Chloride 101 98 - 111 mmol/L   CO2 31 22 - 32 mmol/L   Glucose, Bld 95 70 - 99 mg/dL    Comment: Glucose reference range applies only to samples taken after fasting for at least 8 hours.   BUN 27 (H) 8 - 23 mg/dL   Creatinine, Ser 4.78 (H) 0.44 - 1.00 mg/dL   Calcium  9.3 8.9 - 10.3 mg/dL   Total Protein 6.7 6.5 - 8.1 g/dL   Albumin 3.7 3.5 - 5.0 g/dL   AST 15 15 - 41 U/L   ALT 15 0 - 44 U/L   Alkaline Phosphatase 84 38 - 126 U/L   Total Bilirubin 0.4 0.0 - 1.2 mg/dL   GFR, Estimated 51 (L) >60 mL/min    Comment: (NOTE) Calculated using the CKD-EPI Creatinine Equation (2021)    Anion gap 5 5 - 15    Comment: Performed at [facility], [address], [place], [place] [address]  CBC     Status: None   Collection Time: 06/05/23  6:07 PM  Result Value Ref Range   WBC 9.6 4.0 - 10.5  K/uL   RBC 4.54 3.87 - 5.11 MIL/uL   Hemoglobin 14.5 12.0 - 15.0 g/dL   HCT 13.0 86.5 - 78.4 %   MCV 96.7 80.0 - 100.0 fL   MCH 31.9 26.0 - 34.0 pg   MCHC 33.0 30.0 - 36.0 g/dL   RDW 69.6 29.5 - 28.4 %   Platelets 267 150 - 400 K/uL   nRBC 0.0 0.0 - 0.2 %    Comment: Performed at [facility], [address], [place], [place] [address]  Differential     Status: None   Collection Time: 06/05/23  6:07 PM  Result Value Ref Range   Neutrophils Relative % 63 %   Neutro Abs 6.2 1.7 - 7.7 K/uL   Lymphocytes Relative 25 %   Lymphs Abs 2.4 0.7 - 4.0 K/uL   Monocytes Relative 9 %   Monocytes Absolute 0.9 0.1 - 1.0 K/uL   Eosinophils Relative 1 %   Eosinophils Absolute 0.1 0.0 - 0.5 K/uL   Basophils Relative 1 %   Basophils Absolute 0.1 0.0 - 0.1 K/uL   Immature Granulocytes 1 %   Abs Immature Granulocytes 0.05 0.00 - 0.07 K/uL    Comment: Performed at [facility], [address] [place], [place] [address]  Urinalysis, Routine w reflex microscopic -Urine, Clean Catch     Status: Abnormal   Collection Time: 06/05/23  8:27 PM  Result Value Ref Range   Color, Urine  YELLOW YELLOW   APPearance HAZY (A) CLEAR   Specific Gravity, Urine 1.034 (H) 1.005 - 1.030   pH 6.0 5.0 - 8.0   Glucose, UA NEGATIVE NEGATIVE mg/dL   Hgb urine dipstick NEGATIVE NEGATIVE   Bilirubin Urine NEGATIVE NEGATIVE   Ketones, ur NEGATIVE NEGATIVE mg/dL   Protein, ur NEGATIVE NEGATIVE mg/dL   Nitrite NEGATIVE NEGATIVE   Leukocytes,Ua NEGATIVE NEGATIVE    Comment: Performed at [facility], [address] [place], [place] [address]  Basic metabolic panel     Status: Abnormal   Collection Time: 06/06/23  5:52 AM  Result Value Ref Range   Sodium 137 135 - 145 mmol/L   Potassium 3.8 3.5 - 5.1 mmol/L   Chloride 105 98 - 111 mmol/L   CO2 28 22 - 32 mmol/L   Glucose, Bld 89 70 - 99 mg/dL    Comment: Glucose reference range applies only to samples taken after fasting for at least 8 hours.   BUN 19 8 - 23 mg/dL    Creatinine, Ser 6.57 (H) 0.44 - 1.00 mg/dL   Calcium  8.5 (L) 8.9 - 10.3 mg/dL   GFR, Estimated 58 (L) >60 mL/min    Comment: (NOTE) Calculated using the CKD-EPI Creatinine Equation (2021)    Anion gap 4 (L) 5 - 15    Comment: Performed at [facility], [address] [place], [place] [address]  CBC     Status: None   Collection Time: 06/06/23  5:52 AM  Result Value Ref Range   WBC 10.1 4.0 - 10.5 K/uL   RBC 4.36 3.87 - 5.11 MIL/uL   Hemoglobin 13.6 12.0 - 15.0 g/dL   HCT 29.5 28.4 - 13.2 %   MCV 97.7 80.0 - 100.0 fL   MCH 31.2 26.0 - 34.0 pg   MCHC 31.9 30.0 - 36.0 g/dL   RDW 44.0 10.2 - 72.5 %   Platelets 231 150 - 400 K/uL   nRBC 0.0 0.0 - 0.2 %    Comment: Performed at [facility], [address] [place], [place] [address]   Personally reviewed the CT and showed patient- large stones in the gallbladder, to me the wall looks a little thickened at the dome  CT ABDOMEN PELVIS W CONTRAST Result Date: 06/05/2023 CLINICAL DATA:  Abdominal pain with nausea and vomiting. EXAM: CT ABDOMEN AND PELVIS WITH CONTRAST TECHNIQUE: Multidetector CT imaging of the abdomen and pelvis was performed using the standard protocol following bolus administration of intravenous contrast. RADIATION DOSE REDUCTION: This exam was performed according to the departmental dose-optimization program which includes automated exposure control, adjustment of the mA and/or kV according to patient size and/or use of iterative reconstruction technique. CONTRAST:  100mL OMNIPAQUE  IOHEXOL  300 MG/ML  SOLN COMPARISON:  None Available. FINDINGS: Lower chest: No acute abnormality. Hepatobiliary: No focal liver abnormality is seen. Numerous ill-defined gallstones are seen within an otherwise normal-appearing gallbladder. There is no evidence of gallbladder wall thickening, pericholecystic inflammation or biliary dilatation. Pancreas: Unremarkable. No pancreatic ductal dilatation or surrounding inflammatory changes. Spleen: Normal in  size without focal abnormality. Adrenals/Urinary Tract: Adrenal glands are unremarkable. Kidneys are normal, without renal calculi, focal lesion, or hydronephrosis. Bladder is unremarkable. Stomach/Bowel: Stomach is within normal limits. Appendix appears normal. Stool is seen throughout the large bowel. No evidence of bowel wall thickening, distention, or inflammatory changes. Noninflamed diverticula are seen throughout the sigmoid colon. Vascular/Lymphatic: Aortic atherosclerosis. No enlarged abdominal or pelvic lymph nodes. Reproductive: Uterus and bilateral adnexa are unremarkable. Other: No  abdominal wall hernia or abnormality. No abdominopelvic ascites. Musculoskeletal: Postoperative changes are seen within the lower lumbar spine. Marked severity multilevel degenerative changes are also present. IMPRESSION: 1. Cholelithiasis. 2. Sigmoid diverticulosis. 3. Postoperative changes within the lower lumbar spine. 4. Aortic atherosclerosis. Electronically Signed   By: Virgle Grime M.D.   On: 06/05/2023 20:37   Care Everywhere:  INDICATION: Right upper quadrant pain \ R10.11 Right upper quadrant pain  COMPARISON: None.   TECHNIQUE: Multi-planar, real-time ultrasonography of the abdomen using grayscale imaging was performed, supplemented by color and/or power Doppler as needed.   FINDINGS:   . IVC: Visualized portions are patent.  .  Aorta: Visualized portions are unremarkable.  .  Pancreas: Visualized portions are unremarkable.  .  Liver: Length = 14.3 cm. Normal echogenicity and echotexture. Query minimally nodular contour. No focal lesions. Visualized portions of the portal and hepatic venous systems are patent with antegrade flow.  .  Gallbladder: Cholelithiasis. No gallbladder wall thickening or pericholecystic fluid. Negative sonographic Murphy's sign.  .  Biliary: CBD maximum caliber = 3-4 mm. No intrahepatic ductal dilatation.  .  Right Kidney: Length = 10.9 cm. Normal contour and  echogenicity. No hydronephrosis or perinephric fluid. No focal mass is identified.  .  Peritoneum: No ascites.   Assessment & Plan:  SHEWANDA SHARPE is a 73 y.o. female with gallstones but worsening pain and symptoms after 3 weeks of this starting. She came to the ED. Due to her tenderness, I had them treat her like she was developing cholecystitis. She started antibiotics. If her issues continue, she is already planning for EGD but given the tenderness I think this is her gallbladder and she could be developing some cholecystitis.   PLAN: I counseled the patient about the indication, risks and benefits of robotic assisted laparoscopic cholecystectomy.  She understands there is a very small chance for bleeding, infection, injury to normal structures (including common bile duct), conversion to open surgery, persistent symptoms, evolution of postcholecystectomy diarrhea, need for secondary interventions, anesthesia reaction, cardiopulmonary issues and other risks not specifically detailed here. I described the expected recovery, the plan for follow-up and the restrictions during the recovery phase.  All questions were answered.   Dr. Larrie Po will do surgery tomorrow 8AM. NPO midnight Orders placed  Updated team.   Awilda Bogus 06/06/2023, 8:41 AM

## 2023-06-07 ENCOUNTER — Inpatient Hospital Stay (HOSPITAL_COMMUNITY)

## 2023-06-07 ENCOUNTER — Encounter (HOSPITAL_COMMUNITY)
Admission: EM | Disposition: A | Discharge status: Home / Self Care | Payer: Self-pay | Source: Home / Self Care | Attending: Internal Medicine

## 2023-06-07 ENCOUNTER — Encounter (HOSPITAL_COMMUNITY): Payer: Self-pay | Admitting: Internal Medicine

## 2023-06-07 DIAGNOSIS — R748 Abnormal levels of other serum enzymes: Secondary | ICD-10-CM

## 2023-06-07 DIAGNOSIS — K8020 Calculus of gallbladder without cholecystitis without obstruction: Secondary | ICD-10-CM | POA: Diagnosis not present

## 2023-06-07 HISTORY — PX: DIAGNOSTIC LAPAROSCOPIC LIVER BIOPSY: SHX5797

## 2023-06-07 LAB — COMPREHENSIVE METABOLIC PANEL WITH GFR
ALT: 16 U/L (ref 0–44)
AST: 16 U/L (ref 15–41)
Albumin: 3 g/dL — ABNORMAL LOW (ref 3.5–5.0)
Alkaline Phosphatase: 67 U/L (ref 38–126)
Anion gap: 3 — ABNORMAL LOW (ref 5–15)
BUN: 12 mg/dL (ref 8–23)
CO2: 27 mmol/L (ref 22–32)
Calcium: 8.5 mg/dL — ABNORMAL LOW (ref 8.9–10.3)
Chloride: 108 mmol/L (ref 98–111)
Creatinine, Ser: 0.89 mg/dL (ref 0.44–1.00)
GFR, Estimated: >60 mL/min (ref >60)
Glucose, Bld: 96 mg/dL (ref 70–99)
Potassium: 3.5 mmol/L (ref 3.5–5.1)
Sodium: 138 mmol/L (ref 135–145)
Total Bilirubin: 0.3 mg/dL (ref 0.0–1.2)
Total Protein: 5.5 g/dL — ABNORMAL LOW (ref 6.5–8.1)

## 2023-06-07 SURGERY — CHOLECYSTECTOMY, ROBOT-ASSISTED, LAPAROSCOPIC
Anesthesia: General | Site: Abdomen

## 2023-06-07 MED ORDER — LEVOTHYROXINE SODIUM 125 MCG PO TABS
125.0000 ug | ORAL_TABLET | Freq: Every day | ORAL | Status: DC
  Administered 2023-06-08: 125 ug via ORAL
  Filled 2023-06-07: qty 1

## 2023-06-07 MED ORDER — IPRATROPIUM-ALBUTEROL 0.5-2.5 (3) MG/3ML IN SOLN
3.0000 mL | Freq: Once | RESPIRATORY_TRACT | Status: AC
  Administered 2023-06-07: 3 mL via RESPIRATORY_TRACT

## 2023-06-07 MED ORDER — HYDROMORPHONE HCL 1 MG/ML IJ SOLN
0.2500 mg | INTRAMUSCULAR | Status: DC | PRN
  Administered 2023-06-07 (×3): 0.5 mg via INTRAVENOUS
  Filled 2023-06-07 (×3): qty 0.5

## 2023-06-07 MED ORDER — FENTANYL CITRATE (PF) 250 MCG/5ML IJ SOLN
INTRAMUSCULAR | Status: AC
  Filled 2023-06-07: qty 5

## 2023-06-07 MED ORDER — STERILE WATER FOR IRRIGATION IR SOLN
Status: DC | PRN
  Administered 2023-06-07: 500 mL

## 2023-06-07 MED ORDER — BUPIVACAINE HCL (PF) 0.5 % IJ SOLN
INTRAMUSCULAR | Status: DC | PRN
  Administered 2023-06-07: 30 mL

## 2023-06-07 MED ORDER — OXYCODONE HCL 5 MG PO TABS
5.0000 mg | ORAL_TABLET | Freq: Four times a day (QID) | ORAL | Status: DC | PRN
  Administered 2023-06-07 (×2): 5 mg via ORAL
  Filled 2023-06-07 (×2): qty 1

## 2023-06-07 MED ORDER — HYDROMORPHONE HCL 1 MG/ML IJ SOLN
INTRAMUSCULAR | Status: AC
  Filled 2023-06-07: qty 0.5

## 2023-06-07 MED ORDER — ALBUTEROL SULFATE HFA 108 (90 BASE) MCG/ACT IN AERS
INHALATION_SPRAY | RESPIRATORY_TRACT | Status: AC
  Filled 2023-06-07: qty 6.7

## 2023-06-07 MED ORDER — FENTANYL CITRATE (PF) 250 MCG/5ML IJ SOLN
INTRAMUSCULAR | Status: DC | PRN
  Administered 2023-06-07: 50 ug via INTRAVENOUS
  Administered 2023-06-07 (×2): 100 ug via INTRAVENOUS

## 2023-06-07 MED ORDER — PHENYLEPHRINE HCL (PRESSORS) 10 MG/ML IV SOLN
INTRAVENOUS | Status: DC | PRN
  Administered 2023-06-07: 160 ug via INTRAVENOUS

## 2023-06-07 MED ORDER — ONDANSETRON HCL 4 MG/2ML IJ SOLN
4.0000 mg | Freq: Once | INTRAMUSCULAR | Status: AC | PRN
  Administered 2023-06-07: 4 mg via INTRAVENOUS
  Filled 2023-06-07: qty 2

## 2023-06-07 MED ORDER — IPRATROPIUM-ALBUTEROL 0.5-2.5 (3) MG/3ML IN SOLN
RESPIRATORY_TRACT | Status: AC
  Filled 2023-06-07: qty 3

## 2023-06-07 MED ORDER — SUGAMMADEX SODIUM 200 MG/2ML IV SOLN
INTRAVENOUS | Status: DC | PRN
  Administered 2023-06-07: 200 mg via INTRAVENOUS

## 2023-06-07 MED ORDER — ALBUTEROL SULFATE HFA 108 (90 BASE) MCG/ACT IN AERS
INHALATION_SPRAY | RESPIRATORY_TRACT | Status: DC | PRN
Start: 2023-06-07 — End: 2023-06-07
  Administered 2023-06-07: 4 via RESPIRATORY_TRACT

## 2023-06-07 MED ORDER — KETOROLAC TROMETHAMINE 30 MG/ML IJ SOLN
INTRAMUSCULAR | Status: DC | PRN
  Administered 2023-06-07: 30 mg via INTRAVENOUS

## 2023-06-07 MED ORDER — LACTATED RINGERS IV SOLN: INTRAVENOUS | Status: DC | PRN

## 2023-06-07 MED ORDER — MIDAZOLAM HCL 2 MG/2ML IJ SOLN
INTRAMUSCULAR | Status: DC | PRN
  Administered 2023-06-07: 2 mg via INTRAVENOUS

## 2023-06-07 MED ORDER — SODIUM CHLORIDE 0.9 % IV SOLN: INTRAVENOUS | Status: DC

## 2023-06-07 MED ORDER — HYDROCHLOROTHIAZIDE 25 MG PO TABS: 25.0000 mg | ORAL_TABLET | Freq: Every day | ORAL | Status: DC

## 2023-06-07 MED ORDER — VECURONIUM BROMIDE 10 MG IV SOLR
INTRAVENOUS | Status: DC | PRN
  Administered 2023-06-07: 7 mg via INTRAVENOUS

## 2023-06-07 MED ORDER — SUCCINYLCHOLINE 20MG/ML (10ML) SYRINGE FOR MEDFUSION PUMP - OPTIME
INTRAMUSCULAR | Status: DC | PRN
  Administered 2023-06-07: 100 mg via INTRAVENOUS

## 2023-06-07 MED ORDER — BUPIVACAINE HCL (PF) 0.5 % IJ SOLN
INTRAMUSCULAR | Status: AC
  Filled 2023-06-07: qty 30

## 2023-06-07 MED ORDER — PROPOFOL 10 MG/ML IV BOLUS
INTRAVENOUS | Status: AC
Start: 2023-06-07 — End: ?
  Filled 2023-06-07: qty 20

## 2023-06-07 MED ORDER — HEMOSTATIC AGENTS (NO CHARGE) OPTIME
TOPICAL | Status: DC | PRN
Start: 2023-06-07 — End: 2023-06-07
  Administered 2023-06-07: 1 via TOPICAL

## 2023-06-07 MED ORDER — MIDAZOLAM HCL 2 MG/2ML IJ SOLN
INTRAMUSCULAR | Status: AC
  Filled 2023-06-07: qty 2

## 2023-06-07 MED ORDER — PROPOFOL 10 MG/ML IV BOLUS
INTRAVENOUS | Status: DC | PRN
  Administered 2023-06-07: 130 mg via INTRAVENOUS

## 2023-06-07 MED ORDER — DEXAMETHASONE SODIUM PHOSPHATE 10 MG/ML IJ SOLN
INTRAMUSCULAR | Status: DC | PRN
  Administered 2023-06-07: 10 mg via INTRAVENOUS

## 2023-06-07 SURGICAL SUPPLY — 39 items
CAUTERY HOOK MNPLR 1.6 DVNC XI (INSTRUMENTS) ×2 IMPLANT
CHLORAPREP W/TINT 26 (MISCELLANEOUS) ×2 IMPLANT
CLIP LIGATING HEM O LOK PURPLE (MISCELLANEOUS) ×2 IMPLANT
COVER LIGHT HANDLE STERIS (MISCELLANEOUS) ×2 IMPLANT
DERMABOND ADVANCED .7 DNX12 (GAUZE/BANDAGES/DRESSINGS) ×2 IMPLANT
DRAPE ARM DVNC X/XI (DISPOSABLE) ×8 IMPLANT
DRAPE COLUMN DVNC XI (DISPOSABLE) ×2 IMPLANT
ELECTRODE REM PT RTRN 9FT ADLT (ELECTROSURGICAL) ×2 IMPLANT
FORCEPS BPLR R/ABLATION 8 DVNC (INSTRUMENTS) ×2 IMPLANT
FORCEPS PROGRASP DVNC XI (FORCEP) ×2 IMPLANT
GLOVE BIO SURGEON STRL SZ7 (GLOVE) IMPLANT
GLOVE BIOGEL PI IND STRL 7.0 (GLOVE) ×4 IMPLANT
GLOVE SURG SS PI 7.5 STRL IVOR (GLOVE) ×4 IMPLANT
GOWN STRL REUS W/TWL LRG LVL3 (GOWN DISPOSABLE) ×6 IMPLANT
HEMOSTAT SNOW SURGICEL 2X4 (HEMOSTASIS) IMPLANT
IRRIGATOR SUCT 8 DISP DVNC XI (IRRIGATION / IRRIGATOR) IMPLANT
KIT TURNOVER KIT A (KITS) ×2 IMPLANT
MANIFOLD NEPTUNE II (INSTRUMENTS) ×2 IMPLANT
NDL BIOPSY 14X6 SOFT TISS (NEEDLE) IMPLANT
NDL HYPO 21X1.5 SAFETY (NEEDLE) ×2 IMPLANT
NDL INSUFFLATION 14GA 120MM (NEEDLE) ×2 IMPLANT
NEEDLE BIOPSY 14X6 SOFT TISS (NEEDLE) ×2 IMPLANT
NEEDLE HYPO 21X1.5 SAFETY (NEEDLE) ×2 IMPLANT
NEEDLE INSUFFLATION 14GA 120MM (NEEDLE) ×2 IMPLANT
OBTURATOR OPTICALSTD 8 DVNC (TROCAR) ×2 IMPLANT
PACK LAP CHOLE LZT030E (CUSTOM PROCEDURE TRAY) ×2 IMPLANT
PAD ARMBOARD POSITIONER FOAM (MISCELLANEOUS) ×2 IMPLANT
PAD TELFA 3X4 1S STER (GAUZE/BANDAGES/DRESSINGS) IMPLANT
PENCIL HANDSWITCHING (ELECTRODE) ×2 IMPLANT
POSITIONER HEAD 8X9X4 ADT (SOFTGOODS) ×2 IMPLANT
SEAL UNIV 5-12 XI (MISCELLANEOUS) ×8 IMPLANT
SET BASIN LINEN APH (SET/KITS/TRAYS/PACK) ×2 IMPLANT
SET TUBE SMOKE EVAC HIGH FLOW (TUBING) ×2 IMPLANT
SPIKE FLUID TRANSFER (MISCELLANEOUS) ×2 IMPLANT
SUT MNCRL AB 4-0 PS2 18 (SUTURE) ×4 IMPLANT
SUT VICRYL 0 UR6 27IN ABS (SUTURE) IMPLANT
SYR 30ML LL (SYRINGE) ×2 IMPLANT
SYSTEM RETRIEVL 5MM INZII UNIV (BASKET) ×2 IMPLANT
WATER STERILE IRR 500ML POUR (IV SOLUTION) ×2 IMPLANT

## 2023-06-07 NOTE — Interval H&P Note (Signed)
 History and Physical Interval Note:  06/07/2023 7:30 AM  Judith Hill  has presented today for surgery, with the diagnosis of gallstones, possible cholecysitis.  The various methods of treatment have been discussed with the patient and family. After consideration of risks, benefits and other options for treatment, the patient has consented to  Procedure(s): CHOLECYSTECTOMY, ROBOT-ASSISTED, LAPAROSCOPIC (N/A) as a surgical intervention.  The patient's history has been reviewed, patient examined, no change in status, stable for surgery.  I have reviewed the patient's chart and labs.  Questions were answered to the patient's satisfaction.     Alanda Allegra

## 2023-06-07 NOTE — Op Note (Signed)
 Patient:  Judith Hill  DOB:  1950/08/12  MRN:  [id number]   Preop Diagnosis: Biliary colic, cholelithiasis, history of transaminitis  Postop Diagnosis: Same  Procedure: Robotic assisted laparoscopic cholecystectomy, liver biopsy  Surgeon: Alanda Allegra, MD  Anes: General Endotracheal  Indications: Patient is a 73 year old white female who presents with recurrent biliary colic secondary to cholelithiasis.  She has seen gastroenterology in the past who have requested a liver biopsy.  The risks and benefits of the procedure including bleeding, infection, hepatobiliary injury, the possibility of an open procedure were fully explained to the patient, who gave informed consent.  Procedure note: The patient was placed in the supine position.  After induction of general endotracheal anesthesia, the abdomen was prepped and draped using the usual sterile technique with ChloraPrep.  Surgical site confirmation was performed.  An infraumbilical incision was made down to the fascia.  A Veress needle was introduced into the abdominal cavity and confirmation of placement was done using the saline drop test.  The abdomen was then insufflated to 15 mmHg pressure.  An 8 mm trocar was introduced into the abdominal cavity under direct visualization without difficulty.  In looking at the liver, no gross findings were seen.  A Tru-Cut liver biopsy was performed into the right lobe of the liver.  The specimen was sent to pathology for examination.  A bleeding was controlled using Bovie electrocautery.  The patient was placed in reverse Trendelenburg position and additional 8 mm trocars were placed in the left upper quadrant, right lower quadrant, and right flank regions.  The robot was then docked and targeted.  The gallbladder was retracted in a dynamic fashion in order to provide a critical view of the triangle of Calot.  The cystic duct was first identified.  Its junction to the infundibulum was fully identified.   Hem-o-lok clips were placed proximally and distally on the cystic duct and the cystic duct was divided.  This was likewise done in the cystic artery.  The gallbladder was freed away from the gallbladder fossa using Bovie electrocautery.  The gallbladder was delivered through the left upper quadrant trocar site without difficulty.  The gallbladder fossa was inspected no bile leakage was noted.  Surgicel was placed in the gallbladder fossa.  The robot was then undocked.  All fluid and air were then evacuated from the abdominal cavity prior to the removal of the trocars.  All wounds were irrigated with normal saline.  All wounds were injected with 0.5% Sensorcaine.  The infraumbilical fascia was reapproximated using an 0 Vicryl interrupted suture.  All skin incisions were closed using a 4-0 Monocryl subcuticular suture.  Dermabond was applied.  All tape and needle counts were correct at the end of the procedure.  The patient was extubated in the operating room and transferred to PACU in stable condition.    Complications: None  EBL: Minimal  Specimen: Gallbladder, liver biopsy

## 2023-06-07 NOTE — Transfer of Care (Signed)
 Immediate Anesthesia Transfer of Care Note  Patient: Judith Hill  Procedure(s) Performed: CHOLECYSTECTOMY, ROBOT-ASSISTED, LAPAROSCOPIC BIOPSY, LIVER, LAPAROSCOPIC (Abdomen)  Patient Location: PACU  Anesthesia Type:General  Level of Consciousness: awake, oriented, and patient cooperative  Airway & Oxygen  Therapy: Patient Spontanous Breathing  Post-op Assessment: Report given to RN and Post -op Vital signs reviewed and stable  Post vital signs: Reviewed and stable  Last Vitals:  Vitals Value Taken Time  BP 119/65 06/07/23 1011  Temp 36.6 C 06/07/23 1011  Pulse 81 06/07/23 1011  Resp 16 06/07/23 1011  SpO2 97 % 06/07/23 1011    Last Pain:  Vitals:   06/07/23 1137  TempSrc:   PainSc: 10-Worst pain ever         Complications: No notable events documented.

## 2023-06-07 NOTE — Progress Notes (Signed)
 [facility] PROGRESS NOTE  Judith Hill (DOB: 1950-12-06) WJX:[id number] PCP: Orlena Bitters, DO  Brief Narrative: Judith Hill is a 73 y.o. female with a history of COPD, CVA, HTN, PTSD, and cholelithiasis diagnosed by U/S 5/20 who presented to the ED on 06/05/2023 with worsening RUQ abdominal pain. She was admitted for symptomatic cholelithiasis with surgery consult pending. Plan laparoscopic cholecystectomy 5/26.   Subjective: No changes to her symptoms overnight, eager for surgery.   Objective: BP (!) 120/56 (BP Location: Right Arm)   Pulse 80   Temp 98 F (36.7 C) (Oral)   Resp 17   Ht 5\' 6"  (1.676 m)   Wt 100.5 kg   SpO2 98%   BMI 35.75 kg/m   No distress Clear, nonlabored RRR, no MRG TTP RUQ without rebound, nondistended, +BS Alert, oriented, nonfocal  Assessment & Plan: Symptomatic cholelithiasis: No obstruction noted on labs or CT.  - Surgery consulted, taking for cholecystectomy 5/26, will follow diet advancement recommendations postoperatively.  - Continue ceftriaxone/flagyl.  - Continue IVF and analgesia as ordered.   Headache: Suspected to be tension-related.  - Continue prn tylenol   History of CVA:  - Continue aspirin , statin  Asthma/COPD: Quiescent.  - Continue home controller meds and prn albuterol   Hypokalemia: Resolved with supplementation, will monitor.   PTSD, anxiety:  - Continue bupropion , qHS alprazolam   HTN: Not on medications, BP normal.   IBS:  - Continue linzess, prn miralax  Class II obesity: Body mass index is 35.75 kg/m.   Wynetta Heckle, MD [facility] [email or web address] 06/07/2023, 7:40 AM

## 2023-06-07 NOTE — Progress Notes (Signed)
 Mobility Specialist Progress Note:    06/07/23 1516  Mobility  Activity Ambulated with assistance in room;Ambulated with assistance to bathroom  Level of Assistance Independent after set-up  Assistive Device None  Distance Ambulated (ft) 25 ft  Range of Motion/Exercises Active;All extremities  Activity Response Tolerated well  Mobility Referral Yes  Mobility visit 1 Mobility  Mobility Specialist Start Time (ACUTE ONLY) 1455  Mobility Specialist Stop Time (ACUTE ONLY) 1508  Mobility Specialist Time Calculation (min) (ACUTE ONLY) 13 min   Pt received in bed, requesting assistance to bathroom. After set up, pt independently able to stand and ambulate. Tolerated well, asx throughout. Returned supine, all needs met.  Glinda Lapping Mobility Specialist Please contact via Special educational needs teacher or  Rehab office at [phone]

## 2023-06-07 NOTE — Anesthesia Postprocedure Evaluation (Signed)
 Anesthesia Post Note  Patient: Judith Hill  Procedure(s) Performed: CHOLECYSTECTOMY, ROBOT-ASSISTED, LAPAROSCOPIC BIOPSY, LIVER, LAPAROSCOPIC (Abdomen)  Patient location during evaluation: PACU Anesthesia Type: General Level of consciousness: awake and alert Pain management: pain level controlled Vital Signs Assessment: post-procedure vital signs reviewed and stable Respiratory status: spontaneous breathing, nonlabored ventilation, respiratory function stable and patient connected to nasal cannula oxygen  Cardiovascular status: blood pressure returned to baseline and stable Postop Assessment: no apparent nausea or vomiting Anesthetic complications: no  No notable events documented.   Last Vitals:  Vitals:   06/07/23 1000 06/07/23 1011  BP: 116/72 119/65  Pulse: 81 81  Resp: 14 16  Temp:  36.6 C  SpO2: 100% 97%    Last Pain:  Vitals:   06/07/23 1137  TempSrc:   PainSc: 10-Worst pain ever                 Beacher Limerick

## 2023-06-07 NOTE — Plan of Care (Signed)
   Problem: Education: Goal: Knowledge of General Education information will improve Description: Including pain rating scale, medication(s)/side effects and non-pharmacologic comfort measures Outcome: Progressing   Problem: Clinical Measurements: Goal: Ability to maintain clinical measurements within normal limits will improve Outcome: Progressing Goal: Diagnostic test results will improve Outcome: Progressing

## 2023-06-07 NOTE — Anesthesia Preprocedure Evaluation (Addendum)
 Anesthesia Evaluation  Patient identified by MRN, date of birth, ID band Patient awake    Reviewed: Allergy & Precautions, H&P , NPO status , Patient's Chart, lab work & pertinent test results  Airway Mallampati: II  TM Distance: >3 FB Neck ROM: Full    Dental  (+) Missing, Lower Dentures, Partial Upper   Pulmonary asthma , COPD, Current Smoker and Patient abstained from smoking.   breath sounds clear to auscultation+ rhonchi  + wheezing      Cardiovascular hypertension, Normal cardiovascular exam Rhythm:Regular Rate:Normal     Neuro/Psych  PSYCHIATRIC DISORDERS Anxiety Depression    CVA    GI/Hepatic negative GI ROS, Neg liver ROS,,,  Endo/Other  Hypothyroidism    Renal/GU negative Renal ROS  negative genitourinary   Musculoskeletal  (+) Arthritis ,    Abdominal  (+) + obese Abdomen: tender.   Peds negative pediatric ROS (+)  Hematology negative hematology ROS (+)   Anesthesia Other Findings   Reproductive/Obstetrics negative OB ROS                             Anesthesia Physical Anesthesia Plan  ASA: 3 and emergent  Anesthesia Plan: General   Post-op Pain Management:    Induction: Intravenous, Cricoid pressure planned and Rapid sequence  PONV Risk Score and Plan: 1  Airway Management Planned: Oral ETT  Additional Equipment:   Intra-op Plan:   Post-operative Plan: Extubation in OR  Informed Consent: I have reviewed the patients History and Physical, chart, labs and discussed the procedure including the risks, benefits and alternatives for the proposed anesthesia with the patient or authorized representative who has indicated his/her understanding and acceptance.     Dental advisory given  Plan Discussed with: Surgeon  Anesthesia Plan Comments:        Anesthesia Quick Evaluation

## 2023-06-08 ENCOUNTER — Encounter (HOSPITAL_COMMUNITY): Payer: Self-pay | Admitting: General Surgery

## 2023-06-08 LAB — COMPREHENSIVE METABOLIC PANEL WITH GFR
ALT: 53 U/L — ABNORMAL HIGH (ref 0–44)
AST: 43 U/L — ABNORMAL HIGH (ref 15–41)
Albumin: 2.8 g/dL — ABNORMAL LOW (ref 3.5–5.0)
Alkaline Phosphatase: 60 U/L (ref 38–126)
Anion gap: 3 — ABNORMAL LOW (ref 5–15)
BUN: 15 mg/dL (ref 8–23)
CO2: 23 mmol/L (ref 22–32)
Calcium: 8.2 mg/dL — ABNORMAL LOW (ref 8.9–10.3)
Chloride: 110 mmol/L (ref 98–111)
Creatinine, Ser: 0.99 mg/dL (ref 0.44–1.00)
GFR, Estimated: >60 mL/min (ref >60)
Glucose, Bld: 90 mg/dL (ref 70–99)
Potassium: 3.4 mmol/L — ABNORMAL LOW (ref 3.5–5.1)
Sodium: 136 mmol/L (ref 135–145)
Total Bilirubin: 0.5 mg/dL (ref 0.0–1.2)
Total Protein: 5 g/dL — ABNORMAL LOW (ref 6.5–8.1)

## 2023-06-08 LAB — CBC
HCT: 38 % (ref 36.0–46.0)
Hemoglobin: 12 g/dL (ref 12.0–15.0)
MCH: 31.7 pg (ref 26.0–34.0)
MCHC: 31.6 g/dL (ref 30.0–36.0)
MCV: 100.5 fL — ABNORMAL HIGH (ref 80.0–100.0)
Platelets: 195 10*3/uL (ref 150–400)
RBC: 3.78 MIL/uL — ABNORMAL LOW (ref 3.87–5.11)
RDW: 13.2 % (ref 11.5–15.5)
WBC: 13.8 10*3/uL — ABNORMAL HIGH (ref 4.0–10.5)
nRBC: 0 % (ref 0.0–0.2)

## 2023-06-08 MED ORDER — POTASSIUM CHLORIDE CRYS ER 20 MEQ PO TBCR
20.0000 meq | EXTENDED_RELEASE_TABLET | Freq: Once | ORAL | Status: AC
  Administered 2023-06-08: 20 meq via ORAL
  Filled 2023-06-08: qty 1

## 2023-06-08 NOTE — Plan of Care (Signed)

## 2023-06-08 NOTE — Discharge Summary (Signed)
 Physician Discharge Summary  Patient ID: Judith Hill MRN: [id number] DOB/AGE: 71-Sep-1952 71 y.o.  Admit date: 06/05/2023 Discharge date: 06/08/2023  Admission Diagnoses: Biliary colic, cholelithiasis  Discharge Diagnoses:  Principal Problem:   Cholelithiases Active Problems:   Asthma   COPD   CVA (cerebral vascular accident) (HCC)   Adjustment reaction with anxiety and depression   Essential hypertension   Elevated liver enzymes   Discharged Condition: good  Hospital Course: Patient is a 73 year old white female who presented to [facility] on 06/05/2023 with worsening right upper quadrant abdominal pain and nausea.  CT scan of the abdomen revealed cholelithiasis.  Patient states she also has a history of elevated liver enzyme tests as she is followed by Shana Daring of gastroenterology.  She underwent a robotic assisted laparoscopic cholecystectomy with liver biopsy on 06/07/2023.  She tolerated the surgery well.  Her postoperative course has been unremarkable.  Her diet was advanced without difficulty.  Final pathology is pending.  She is being discharged home on 06/08/2023 in good and improving condition.  Treatments: surgery: Robotic assisted laparoscopic cholecystectomy, liver biopsy on 06/07/2023  Discharge Exam: Blood pressure (!) 101/58, pulse (!) 101, temperature 99.2 F (37.3 C), temperature source Oral, resp. rate 18, height 5\' 6"  (1.676 m), weight 100.5 kg, SpO2 96%. General appearance: alert, cooperative, and no distress Resp: clear to auscultation bilaterally Cardio: regular rate and rhythm, S1, S2 normal, no murmur, click, rub or gallop GI: Soft, incisions healing well  Disposition: Discharge disposition: 01-Home or Self Care       Discharge Instructions     Diet - low sodium heart healthy   Complete by: As directed    Increase activity slowly   Complete by: As directed       Allergies as of 06/08/2023       Reactions   Penicillins Rash,  Other (See Comments)   Has patient had a PCN reaction causing immediate rash, facial/tongue/throat swelling, SOB or lightheadedness with hypotension: No Has patient had a PCN reaction causing severe rash involving mucus membranes or skin necrosis: No Has patient had a PCN reaction that required hospitalization: No Has patient had a PCN reaction occurring within the last 10 years: No If all of the above answers are "NO", then may proceed with Cephalosporin use.        Medication List     TAKE these medications    ALPRAZolam  1 MG tablet Commonly known as: XANAX  Take 1 mg by mouth at bedtime.   aspirin  EC 81 MG tablet Commonly known as: Aspirin  81 Take 1 tablet (81 mg total) by mouth daily. Swallow whole.   atorvastatin  20 MG tablet Commonly known as: LIPITOR Take 1 tablet (20 mg total) by mouth daily.   buPROPion  300 MG 24 hr tablet Commonly known as: WELLBUTRIN  XL Take 300 mg by mouth every morning.   buPROPion  150 MG 24 hr tablet Commonly known as: WELLBUTRIN  XL Take 150 mg by mouth every morning.   hydrochlorothiazide 25 MG tablet Commonly known as: HYDRODIURIL Take 25 mg by mouth daily.   HYDROcodone -acetaminophen  10-325 MG tablet Commonly known as: NORCO Take 1 tablet by mouth 3 (three) times daily as needed. for pain   levothyroxine  125 MCG tablet Commonly known as: SYNTHROID  Take 125 mcg by mouth daily before breakfast.   Linzess 72 MCG capsule Generic drug: linaclotide Take 72 mcg by mouth daily as needed.   meclizine  25 MG tablet Commonly known as: ANTIVERT  Take 25 mg by mouth  2 (two) times daily as needed for dizziness.   multivitamins ther. w/minerals Tabs tablet Take 1 tablet by mouth daily.   Trelegy Ellipta 200-62.5-25 MCG/ACT Aepb Generic drug: Fluticasone-Umeclidin-Vilant Inhale 1 puff into the lungs daily at 12 noon.        Follow-up Information     Alanda Allegra, MD Follow up.   Specialty: General Surgery Why: As needed.  Will call  you next week for follow up. Contact information: [address] [place] [address] [phone]                 Signed: Alanda Allegra 06/08/2023, 8:10 AM

## 2023-06-08 NOTE — Progress Notes (Signed)
 Pt has ambulated in hallway this am independently without difficulty. Pt eager to go home. MD Celestina Colas in to see pt, OK to discharge. Pt tolerated breakfast well. Pt discharged via WC to POV with her belongings in her possession.

## 2023-06-09 LAB — SURGICAL PATHOLOGY

## 2023-06-18 ENCOUNTER — Encounter: Payer: Self-pay | Admitting: General Surgery

## 2023-06-18 ENCOUNTER — Telehealth: Payer: Self-pay | Admitting: Gastroenterology

## 2023-06-18 ENCOUNTER — Ambulatory Visit: Admitting: General Surgery

## 2023-06-18 DIAGNOSIS — K8020 Calculus of gallbladder without cholecystitis without obstruction: Secondary | ICD-10-CM

## 2023-06-18 DIAGNOSIS — Z09 Encounter for follow-up examination after completed treatment for conditions other than malignant neoplasm: Secondary | ICD-10-CM

## 2023-06-18 NOTE — Progress Notes (Signed)
 Subjective:     Judith Hill  Postoperative virtual telephone visit performed with patient.  Patient states she is doing well.  She is trying to resume normal activity.  Her appetite is a little decreased but she states that she just thinks that secondary to the surgery since she has not had general anesthesia for many years.  She denies any fever or chills. Objective:    There were no vitals taken for this visit.  General:  alert, cooperative, and no distress  Final pathology discussed with patient.  Liver biopsy showed mild steatosis.  I told her to discuss this with her gastroenterologist.     Assessment:    Doing well postoperatively.    Plan:   May resume normal activities.  Follow-up with me as needed.  As this was a part of the total global surgical fee, this was not a billable visit.  Total telephone time was 3 minutes.

## 2023-06-18 NOTE — Telephone Encounter (Signed)
 error
